# Patient Record
Sex: Female | Born: 2000 | Race: White | Hispanic: No | Marital: Single | Smoking: Never smoker
Health system: Southern US, Community
[De-identification: ages and names within clinical notes are randomized; demographics above are authoritative.]

## PROBLEM LIST (undated history)

## (undated) DIAGNOSIS — T7840XA Allergy, unspecified, initial encounter: Secondary | ICD-10-CM

## (undated) HISTORY — DX: Allergy, unspecified, initial encounter: T78.40XA

---
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
## 2020-03-05 ENCOUNTER — Encounter: Payer: Self-pay | Admitting: Family Medicine

## 2020-03-05 DIAGNOSIS — J0391 Acute recurrent tonsillitis, unspecified: Secondary | ICD-10-CM

## 2020-03-07 NOTE — Telephone Encounter (Signed)
I have faxed the referral over to the office and made pt aware of this.

## 2020-04-18 DIAGNOSIS — M79671 Pain in right foot: Secondary | ICD-10-CM | POA: Insufficient documentation

## 2020-04-18 DIAGNOSIS — M9270 Juvenile osteochondrosis of metatarsus, unspecified foot: Secondary | ICD-10-CM | POA: Insufficient documentation

## 2020-06-18 ENCOUNTER — Encounter: Payer: 59 | Admitting: Family Medicine

## 2020-06-19 ENCOUNTER — Encounter: Payer: Self-pay | Admitting: Family Medicine

## 2020-06-19 ENCOUNTER — Other Ambulatory Visit: Payer: Self-pay

## 2020-06-19 ENCOUNTER — Ambulatory Visit (INDEPENDENT_AMBULATORY_CARE_PROVIDER_SITE_OTHER): Payer: 59 | Admitting: Family Medicine

## 2020-06-19 DIAGNOSIS — Z0000 Encounter for general adult medical examination without abnormal findings: Secondary | ICD-10-CM | POA: Diagnosis not present

## 2020-06-19 MED ORDER — TRIAMCINOLONE ACETONIDE 0.1 % EX OINT: 1.0000 "application " | TOPICAL_OINTMENT | Freq: Two times a day (BID) | CUTANEOUS | 1 refills | Status: AC

## 2020-06-19 NOTE — Patient Instructions (Signed)
Follow up in 1 year or as needed No need for labs today- yay!!! Continue to work on healthy diet and regular exercise- you look great! Call with any questions or concerns Stay Safe!  Stay Healthy! Have a great summer!!!!

## 2020-06-19 NOTE — Progress Notes (Signed)
   Subjective:    Patient ID: Wendy Sanders, female    DOB: August 21, 2000, 20 y.o.   MRN: [id number]  HPI CPE- UTD on Tdap, COVID.  Home for the summer, trying to get a job.  Transferring to [employer] and will be living at home.  Pt is excited about the change.  Now majoring in nursing.  Reviewed past medical, surgical, family and social histories.   Health Maintenance  Topic Date Due   CHLAMYDIA SCREENING  06/19/2021 (Originally 12/01/2015)   Hepatitis C Screening  06/19/2021 (Originally 12/01/2018)   HIV Screening  06/19/2021 (Originally 12/01/2015)   INFLUENZA VACCINE  08/11/2020   TETANUS/TDAP  06/09/2021   Zoster Vaccines- Shingrix (1 of 2) 12/01/2050   HPV VACCINES  Completed   Pneumococcal Vaccine 76-104 Years old  Aged Out      Review of Systems Patient reports no vision/ hearing changes, fever, weight change,  persistant/recurrent hoarseness , swallowing issues, chest pain, palpitations, edema, persistant/recurrent cough, hemoptysis, dyspnea (rest/exertional/paroxysmal nocturnal), gastrointestinal bleeding (melena, rectal bleeding), abdominal pain, significant heartburn, bowel changes, GU symptoms (dysuria, hematuria, incontinence), Gyn symptoms (abnormal  bleeding, pain),  syncope, focal weakness, memory loss, numbness & tingling, hair/nail changes, abnormal bruising or bleeding, anxiety, or depression.   Transient LAD- typically resolve after 1 week.  Had one under each armpit, in groin, in neck.  Doesn't feel sick.  + rash R elbow- not itchy  This visit occurred during the SARS-CoV-2 public health emergency.  Safety protocols were in place, including screening questions prior to the visit, additional usage of staff PPE, and extensive cleaning of exam room while observing appropriate contact time as indicated for disinfecting solutions.      Objective:   Physical Exam General Appearance:    Alert, cooperative, no distress, appears stated age  Head:    Normocephalic, without obvious  abnormality, atraumatic  Eyes:    PERRL, conjunctiva/corneas clear, EOM's intact, fundi    benign, both eyes  Ears:    Normal TM's and external ear canals, both ears  Nose:   Deferred due to COVID  Throat:   Neck:   Supple, symmetrical, trachea midline, no adenopathy;    Thyroid: no enlargement/tenderness/nodules  Back:     Symmetric, no curvature, ROM normal, no CVA tenderness  Lungs:     Clear to auscultation bilaterally, respirations unlabored  Chest Wall:    No tenderness or deformity   Heart:    Regular rate and rhythm, S1 and S2 normal, no murmur, rub   or gallop  Breast Exam:    Deferred to GYN  Abdomen:     Soft, non-tender, bowel sounds active all four quadrants,    no masses, no organomegaly  Genitalia:    Deferred to GYN  Rectal:    Extremities:   Extremities normal, atraumatic, no cyanosis or edema  Pulses:   2+ and symmetric all extremities  Skin:   Skin color, texture, turgor normal, no rashes or lesions  Lymph nodes:   Cervical, supraclavicular, and axillary nodes normal  Neurologic:   CNII-XII intact, normal strength, sensation and reflexes    throughout          Assessment & Plan:

## 2020-06-19 NOTE — Assessment & Plan Note (Signed)
Pt's PE WNL.  UTD on Tdap, COVID.  Too young to start pap smears- due at age 20.  No need for labs based on risk factors.  Anticipatory guidance provided.

## 2020-06-25 ENCOUNTER — Other Ambulatory Visit: Payer: Self-pay | Admitting: Orthopaedic Surgery

## 2020-06-25 DIAGNOSIS — M79674 Pain in right toe(s): Secondary | ICD-10-CM

## 2020-06-26 ENCOUNTER — Other Ambulatory Visit: Payer: Self-pay

## 2020-06-26 ENCOUNTER — Other Ambulatory Visit: Payer: Self-pay | Admitting: Orthopaedic Surgery

## 2020-06-26 ENCOUNTER — Ambulatory Visit
Admission: RE | Admit: 2020-06-26 | Discharge: 2020-06-26 | Disposition: A | Discharge status: Home / Self Care | Payer: 59 | Source: Ambulatory Visit | Attending: Orthopaedic Surgery | Admitting: Orthopaedic Surgery

## 2020-06-26 DIAGNOSIS — M79674 Pain in right toe(s): Secondary | ICD-10-CM

## 2020-07-02 ENCOUNTER — Other Ambulatory Visit: Payer: Self-pay

## 2020-07-02 ENCOUNTER — Ambulatory Visit
Admission: RE | Admit: 2020-07-02 | Discharge: 2020-07-02 | Disposition: A | Discharge status: Home / Self Care | Payer: 59 | Source: Ambulatory Visit | Attending: Orthopaedic Surgery | Admitting: Orthopaedic Surgery

## 2020-07-02 DIAGNOSIS — M79674 Pain in right toe(s): Secondary | ICD-10-CM

## 2020-07-09 ENCOUNTER — Encounter: Payer: Self-pay | Admitting: *Deleted

## 2020-07-20 ENCOUNTER — Encounter: Payer: Self-pay | Admitting: Family Medicine

## 2020-08-19 ENCOUNTER — Other Ambulatory Visit: Payer: Self-pay

## 2020-08-19 ENCOUNTER — Encounter: Payer: Self-pay | Admitting: Family Medicine

## 2020-08-19 DIAGNOSIS — J0391 Acute recurrent tonsillitis, unspecified: Secondary | ICD-10-CM

## 2020-09-01 ENCOUNTER — Other Ambulatory Visit: Payer: Self-pay

## 2020-09-01 ENCOUNTER — Encounter: Payer: Self-pay | Admitting: Podiatry

## 2020-09-01 ENCOUNTER — Ambulatory Visit (INDEPENDENT_AMBULATORY_CARE_PROVIDER_SITE_OTHER): Payer: 59

## 2020-09-01 ENCOUNTER — Ambulatory Visit (INDEPENDENT_AMBULATORY_CARE_PROVIDER_SITE_OTHER): Payer: 59 | Admitting: Podiatry

## 2020-09-01 DIAGNOSIS — M79672 Pain in left foot: Secondary | ICD-10-CM

## 2020-09-01 DIAGNOSIS — M778 Other enthesopathies, not elsewhere classified: Secondary | ICD-10-CM | POA: Diagnosis not present

## 2020-09-01 DIAGNOSIS — M79671 Pain in right foot: Secondary | ICD-10-CM

## 2020-09-01 DIAGNOSIS — M779 Enthesopathy, unspecified: Secondary | ICD-10-CM

## 2020-09-01 MED ORDER — TRIAMCINOLONE ACETONIDE 10 MG/ML IJ SUSP
10.0000 mg | Freq: Once | INTRAMUSCULAR | Status: AC
  Administered 2020-09-01: 10 mg

## 2020-09-02 NOTE — Progress Notes (Signed)
Subjective:   Patient ID: Wendy Sanders, female   DOB: 20 y.o.   MRN: [id number]   HPI Patient presents stating that she has pain in her right forefoot of at least 8 months duration and states that she saw another physician who said she has Freiberg's.  She is had MRI done and it did indicate inflammation in the area and she had 1 cortisone injection which gave her approximate 6 weeks relief of symptoms.  She is starting college again now and is concerned about the pain she is experiencing in her right foot and presents for opinion.  Patient does not smoke likes to be active   Review of Systems  All other systems reviewed and are negative.      Objective:  Physical Exam Vitals and nursing note reviewed.  Constitutional:      Appearance: She is well-developed.  Pulmonary:     Effort: Pulmonary effort is normal.  Musculoskeletal:        General: Normal range of motion.  Skin:    General: Skin is warm.  Neurological:     Mental Status: She is alert.    Neurovascular status found to be intact muscle strength is found to be adequate range of motion adequate.  Patient is noted to have inflammation with fluid buildup around the second MPJ right and tenderness with pressure with no loss of joint motion or crepitus.  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  I do think it 1 point the consideration of a Hemi implant or possible core chondral bone drilling should be considered but at this point we want to try to help conservatively to buy her time.  I did do a proximal nerve block of the area and after appropriate numbness I aspirated the joint getting out a small amount of clear fluid and injected with quarter cc dexamethasone Kenalog and applied thick plantar pads  to try to disseminate weight off the joint.  She does have a graphite brace that was given to her that its not really giving her relief and we will try not to use it and I discussed orthotics or other treatments or possible surgery depending on her response to this treatment option  X-rays indicate what appears to be a Freiberg's infarction of the second metatarsal head right with shortening of the bone with a normal pattern except for elongated first metatarsal bones bilateral that are nonsymptomatic

## 2020-09-05 ENCOUNTER — Other Ambulatory Visit: Payer: Self-pay | Admitting: Podiatry

## 2020-09-05 DIAGNOSIS — M779 Enthesopathy, unspecified: Secondary | ICD-10-CM

## 2020-09-11 ENCOUNTER — Telehealth (INDEPENDENT_AMBULATORY_CARE_PROVIDER_SITE_OTHER): Payer: 59 | Admitting: Adult Health

## 2020-09-11 VITALS — Wt 140.0 lb

## 2020-09-11 DIAGNOSIS — J069 Acute upper respiratory infection, unspecified: Secondary | ICD-10-CM | POA: Diagnosis not present

## 2020-09-11 MED ORDER — AMOXICILLIN 500 MG PO TABS: 500.0000 mg | ORAL_TABLET | Freq: Two times a day (BID) | ORAL | 0 refills | Status: AC

## 2020-09-11 NOTE — Progress Notes (Signed)
Virtual Visit via Video Note  I connected with Wendy Sanders 09/11/20 at 11:00 AM EDT by a video enabled telemedicine application and verified that I am speaking with the correct person using two identifiers.  Location patient: home Location provider:work or home office Persons participating in the virtual visit: patient, provider  I discussed the limitations of evaluation and management by telemedicine and the availability of in person appointments. The patient expressed understanding and agreed to proceed.   HPI: 20 year old female, patient of Dr. Birdie Riddle, who I am evaluating today for an acute issue.  Her symptoms started roughly 4 days ago.  Symptoms include fever up to 101 ( resolved), chest congestion( resolved), nasal congestion, and a dry cough.  He denies chills, nausea, vomiting, diarrhea, shortness of breath.  He does feel as though she has improved to some degree over the last 24 hours.  Has been using Sudafed for 2 days and  Mucinex for 4 days, and took a dose of ibuprofen when she had a fever yesterday.  She did test negative for COVID via at home test.  Had COVID roughly a month ago.   ROS: See pertinent positives and negatives per HPI.  Past Medical History:  Diagnosis Date   Allergy     No past surgical history on file.  Family History  Problem Relation Age of Onset   Diabetes Father    Hypertension Maternal Grandmother    Diabetes Maternal Grandmother    Heart disease Paternal Grandfather        Current Outpatient Medications:    triamcinolone ointment (KENALOG) 0.1 %, Apply 1 application topically 2 (two) times daily., Disp: 90 g, Rfl: 1   COVID-19 Specimen Collection KIT, TEST AS DIRECTED TODAY (Patient not taking: No sig reported), Disp: , Rfl:    meloxicam (MOBIC) 7.5 MG tablet, Take 7.5 mg by mouth 3 (three) times daily. (Patient not taking: Reported on 09/11/2020), Disp: , Rfl:   EXAM:  VITALS per patient if applicable:  GENERAL: alert, oriented,  appears well and in no acute distress  HEENT: atraumatic, conjunttiva clear, no obvious abnormalities on inspection of external nose and ears  NECK: normal movements of the head and neck  LUNGS: on inspection no signs of respiratory distress, breathing rate appears normal, no obvious gross SOB, gasping or wheezing  CV: no obvious cyanosis  MS: moves all visible extremities without noticeable abnormality  PSYCH/NEURO: pleasant and cooperative, no obvious depression or anxiety, speech and thought processing grossly intact  ASSESSMENT AND PLAN:  Discussed the following assessment and plan:  1. Viral URI with cough -Explained to the patient that this is likely a viral URI.  She was advised rest and hydration, continue with over-the-counter medications.  Due to being a long holiday weekend, will send in amoxicillin that she can take if her symptoms do not continue to improve into the weekend. - amoxicillin (AMOXIL) 500 MG tablet; Take 1 tablet (500 mg total) by mouth 2 (two) times daily for 7 days.  Dispense: 14 tablet; Refill: 0 - Follow up PRN      I discussed the assessment and treatment plan with the patient. The patient was provided an opportunity to ask questions and all were answered. The patient agreed with the plan and demonstrated an understanding of the instructions.   The patient was advised to call back or seek an in-person evaluation if the symptoms worsen or if the condition fails to improve as anticipated.   Wendy Peng, NP

## 2020-10-20 ENCOUNTER — Other Ambulatory Visit: Payer: Self-pay

## 2020-10-20 ENCOUNTER — Ambulatory Visit (INDEPENDENT_AMBULATORY_CARE_PROVIDER_SITE_OTHER): Payer: 59

## 2020-10-20 ENCOUNTER — Ambulatory Visit (INDEPENDENT_AMBULATORY_CARE_PROVIDER_SITE_OTHER): Payer: 59 | Admitting: Podiatry

## 2020-10-20 ENCOUNTER — Encounter: Payer: Self-pay | Admitting: Podiatry

## 2020-10-20 DIAGNOSIS — S92324G Nondisplaced fracture of second metatarsal bone, right foot, subsequent encounter for fracture with delayed healing: Secondary | ICD-10-CM

## 2020-10-20 DIAGNOSIS — M778 Other enthesopathies, not elsewhere classified: Secondary | ICD-10-CM

## 2020-10-20 NOTE — Progress Notes (Signed)
Subjective:   Patient ID: Wendy Sanders, female   DOB: 20 y.o.   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  I do think ultimate surgical be necessary for this but we are concerned about changes within the second metatarsal head and the possibility that there is still some chondral reactive bone that is in an active phase and I did discuss this case with Dr. Sherryle Lis we reviewed x-rays and were both in agreement that a bone stimulator would be of short-term benefit with the possibility that this may improve her condition and prevent her from needing surgery.  Long-term very good possibility she will need some form of surgery with possibility for OATS procedure or some type of bone graft for long-term possible implant.  Very difficult problem for a young girl to have but with the change in bone and inflammation ultimately will have to be addressed  X-rays indicate that there is continued reactivity of the subchondral bone on multiple views with what appears to be collapse of the cartilage secondary to either injury or advanced Freiberg syndrome.  Discussed with patient and mother and do think that bone stimulator would be of benefit in this condition

## 2020-11-17 ENCOUNTER — Ambulatory Visit (INDEPENDENT_AMBULATORY_CARE_PROVIDER_SITE_OTHER): Payer: 59 | Admitting: Podiatry

## 2020-11-17 ENCOUNTER — Other Ambulatory Visit: Payer: Self-pay

## 2020-11-17 DIAGNOSIS — M9271 Juvenile osteochondrosis of metatarsus, right foot: Secondary | ICD-10-CM

## 2020-11-18 NOTE — Progress Notes (Signed)
  Subjective:  Patient ID: Wendy Sanders, female    DOB: April 25, 2000,  MRN: [id number]  Chief Complaint  Patient presents with   Fracture     4 WEEK FU  Freiberg disease     20 y.o. female presents with the above complaint. History confirmed with patient.  She is referred to me by Dr. Paulla Dolly for Wendy Sanders disease of the second MTPJ.  She has been using ultrasound bone stimulator for the last 4 weeks.  I previously examined her at her last appointment with him.  Since our neurostimulator she does not see that it has is helped much and may actually be more painful than previous.  Objective:  Physical Exam: warm, good capillary refill, no trophic changes or ulcerative lesions, normal DP and PT pulses, and normal sensory exam. Left Foot: normal exam, no swelling, tenderness, instability; ligaments intact, full range of motion of all ankle/foot joints Right Foot: Pain swelling and edema around the second MTPJ with range of motion that is limited  No images are attached to the encounter.  Radiographs: Multiple views x-ray of the right foot: Prior x-rays show flattening of the MTPJ of the metatarsal head  IMPRESSION: Flattening and central depression of the second metatarsal head articular surface with nondisplaced subchondral fracture and adjacent bony edema, compatible with Freiberg's infraction. Small reactive second MTP joint effusion.     Electronically Signed   By: Maurine Simmering   On: 07/03/2020 09:58 Assessment:  No diagnosis found.   Plan:  Patient was evaluated and treated and all questions answered.  I reviewed my clinical exam findings as well as the radiographs and last MRI findings again with the patient and her mother.  Has not had much improvement with noninvasive bone stimulation.  At this point she has had several months of nonsurgical treatment and has not improved.  I recommend we proceed with surgical intervention as proposed.  Surgical we discussed curettage of the  metatarsal head of the second MTPJ, bone grafting from calcaneal autograft and cartilage allografting.  All questions were addressed.  We discussed the risk benefits and potential complications including but not limited to pain, swelling, infection, scar, numbness which may be temporary or permanent, chronic pain, stiffness, nerve pain or damage, wound healing problems, bone healing problems including delayed or non-union.  Surgery scheduled for December 9.  I would like her to continue ultrasound bone stimulation after surgery as well.  We discussed a period of nonweightbearing for 4 to 6 weeks.  Informed consent was signed and reviewed   Surgical plan:  Procedure: -Surgical decompression of second MTPJ of Freiberg, calcaneal autograft, cartilage allografting  Location: -[facility]  Anesthesia plan: -IV sedation with regional block  Postoperative pain plan: - Tylenol 1000 mg every 6 hours, ibuprofen 600 mg every 6 hours, gabapentin 300 mg every 8 hours x5 days, oxycodone 5 mg 1-2 tabs every 6 hours only as needed  DVT prophylaxis: -None required  WB Restrictions / DME needs: -NWB in CAM boot which she has currently    No follow-ups on file.

## 2020-12-02 ENCOUNTER — Encounter: Payer: Self-pay | Admitting: Family Medicine

## 2020-12-02 ENCOUNTER — Other Ambulatory Visit: Payer: Self-pay

## 2020-12-03 ENCOUNTER — Other Ambulatory Visit: Payer: Self-pay | Admitting: Registered Nurse

## 2020-12-03 DIAGNOSIS — Z3041 Encounter for surveillance of contraceptive pills: Secondary | ICD-10-CM

## 2020-12-03 MED ORDER — NORETHINDRONE ACET-ETHINYL EST 1.5-30 MG-MCG PO TABS: 1.0000 | ORAL_TABLET | Freq: Every day | ORAL | 3 refills | Status: DC

## 2020-12-09 DIAGNOSIS — M79676 Pain in unspecified toe(s): Secondary | ICD-10-CM

## 2020-12-10 ENCOUNTER — Telehealth: Payer: Self-pay | Admitting: Urology

## 2020-12-10 NOTE — Telephone Encounter (Signed)
DOS - 12/19/20  ARTHROPLASTY LESSER MPJ 2ND RIGHT --- 28122 BONE GRAFT --- 20900  [facility] EFFECTIVE DATE - 2014-01-23  PER [employer] WEBSITE CPT CODES 55001 AND 64290 HAVE BEEN APPROVED, [place] # [id number], GOOD FROM 12/19/20 - 03/19/21

## 2020-12-19 ENCOUNTER — Other Ambulatory Visit: Payer: Self-pay | Admitting: Podiatry

## 2020-12-19 DIAGNOSIS — M9271 Juvenile osteochondrosis of metatarsus, right foot: Secondary | ICD-10-CM | POA: Diagnosis not present

## 2020-12-19 MED ORDER — ACETAMINOPHEN 500 MG PO TABS: 1000.0000 mg | ORAL_TABLET | Freq: Four times a day (QID) | ORAL | 0 refills | Status: AC | PRN

## 2020-12-19 MED ORDER — IBUPROFEN 600 MG PO TABS: 600.0000 mg | ORAL_TABLET | Freq: Four times a day (QID) | ORAL | 0 refills | Status: AC | PRN

## 2020-12-19 MED ORDER — GABAPENTIN 300 MG PO CAPS: 300.0000 mg | ORAL_CAPSULE | Freq: Three times a day (TID) | ORAL | 0 refills | Status: DC

## 2020-12-19 MED ORDER — OXYCODONE HCL 5 MG PO TABS: 5.0000 mg | ORAL_TABLET | ORAL | 0 refills | Status: AC | PRN

## 2020-12-19 NOTE — Progress Notes (Signed)
12/9 Freiberg decompression, bone / cartilage grafting

## 2020-12-23 ENCOUNTER — Other Ambulatory Visit: Payer: Self-pay | Admitting: Podiatry

## 2020-12-25 ENCOUNTER — Ambulatory Visit (INDEPENDENT_AMBULATORY_CARE_PROVIDER_SITE_OTHER): Payer: 59 | Admitting: Podiatry

## 2020-12-25 ENCOUNTER — Ambulatory Visit (INDEPENDENT_AMBULATORY_CARE_PROVIDER_SITE_OTHER): Payer: 59

## 2020-12-25 ENCOUNTER — Other Ambulatory Visit: Payer: Self-pay

## 2020-12-25 DIAGNOSIS — M9271 Juvenile osteochondrosis of metatarsus, right foot: Secondary | ICD-10-CM

## 2020-12-25 DIAGNOSIS — S92324G Nondisplaced fracture of second metatarsal bone, right foot, subsequent encounter for fracture with delayed healing: Secondary | ICD-10-CM | POA: Diagnosis not present

## 2020-12-25 NOTE — Progress Notes (Signed)
°  Subjective:  Patient ID: Wendy Sanders, female    DOB: [birth year],  MRN: [id number]  Chief Complaint  Patient presents with   Routine Post Op    POV #1 DOS 12/19/2020 TREATMENT OF FREIBERG'S DISEASE/2ND METATARSAL FRACTURE W/BONE GRAFT FROM HEEL AND CARTILAGE GRAFT      20 y.o. female returns for post-op check.  Doing well, pain was significant the first few days and is improving now  Review of Systems: Negative except as noted in the HPI. Denies N/V/F/Ch.   Objective:  There were no vitals filed for this visit. There is no height or weight on file to calculate BMI. Constitutional Well developed. Well nourished.  Vascular Foot warm and well perfused. Capillary refill normal to all digits.  Calf is soft and supple, no posterior calf or knee pain, negative Homans' sign  Neurologic Normal speech. Oriented to person, place, and time. Epicritic sensation to light touch grossly present bilaterally.  Dermatologic Skin healing well without signs of infection. Skin edges well coapted without signs of infection.  Minor abrasion anterior ankle superficial without skin disruption  Orthopedic: Tenderness to palpation noted about the surgical site.   Multiple view plain film radiographs: Kirschner wire intact and in good position Assessment:   1. Freiberg's disease, right   2. Closed nondisplaced fracture of second metatarsal bone of right foot with delayed healing, subsequent encounter    Plan:  Patient was evaluated and treated and all questions answered.  S/p foot surgery right -Progressing as expected post-operatively. -XR: As above -WB Status: NWB in CAM boot -Sutures: We will remove next visit. -Medications: No refills required -Foot redressed.  Plan to remove sutures and pin at next visit and begin range of motion of toe, continue nonweightbearing for 6 weeks total  No follow-ups on file.

## 2021-01-08 ENCOUNTER — Other Ambulatory Visit: Payer: Self-pay

## 2021-01-08 ENCOUNTER — Ambulatory Visit (INDEPENDENT_AMBULATORY_CARE_PROVIDER_SITE_OTHER): Payer: 59 | Admitting: Podiatry

## 2021-01-08 DIAGNOSIS — S92324G Nondisplaced fracture of second metatarsal bone, right foot, subsequent encounter for fracture with delayed healing: Secondary | ICD-10-CM

## 2021-01-08 DIAGNOSIS — M9271 Juvenile osteochondrosis of metatarsus, right foot: Secondary | ICD-10-CM

## 2021-01-12 NOTE — Progress Notes (Signed)
°  Subjective:  Patient ID: Wendy Sanders, female    DOB: 02/03/2000,  MRN: [id number]  Chief Complaint  Patient presents with   Routine Post Op      POV #2 DOS 12/19/2020 TREATMENT OF FREIBERG'S DISEASE/2ND METATARSAL FRACTURE W/BONE GRAFT FROM HEEL AND CARTILAGE GRAFT      21 y.o. female returns for post-op check.  Doing well, pain has been improving  Review of Systems: Negative except as noted in the HPI. Denies N/V/F/Ch.   Objective:  There were no vitals filed for this visit. There is no height or weight on file to calculate BMI. Constitutional Well developed. Well nourished.  Vascular Foot warm and well perfused. Capillary refill normal to all digits.  Calf is soft and supple, no posterior calf or knee pain, negative Homans' sign  Neurologic Normal speech. Oriented to person, place, and time. Epicritic sensation to light touch grossly present bilaterally.  Dermatologic Skin healing well without signs of infection. Skin edges well coapted without signs of infection.  Abrasion anterior ankle is healing  Orthopedic: Tenderness to palpation noted about the surgical site.   Multiple view plain film radiographs: Kirschner wire intact and in good position Assessment:   1. Closed nondisplaced fracture of second metatarsal bone of right foot with delayed healing, subsequent encounter   2. Freiberg's disease, right     Plan:  Patient was evaluated and treated and all questions answered.  S/p foot surgery right -Doing well I removed the Kirschner wire today and all of her sutures.  She may begin bathing regularly and I would like her to begin manual passive and active range of motion of the digit.  Continue nonweightbearing in the cam boot only.  Return in about 3 weeks (around 01/29/2021) for post op (new x-rays).

## 2021-01-19 ENCOUNTER — Telehealth: Payer: Self-pay | Admitting: *Deleted

## 2021-01-19 NOTE — Telephone Encounter (Signed)
Called patient and gave instructions per Dr Sherryle Lis to restart the bone stimulator, verbalized understanding and will restart right away.

## 2021-01-19 NOTE — Telephone Encounter (Signed)
Patient is calling for instructions to start back using the bone stimulator again or has she completed?Please advise.

## 2021-01-19 NOTE — Telephone Encounter (Signed)
Yes she should restart using it

## 2021-01-20 ENCOUNTER — Telehealth: Payer: Self-pay | Admitting: Podiatry

## 2021-01-20 NOTE — Telephone Encounter (Signed)
Patient is scheduled to return to work 01/26/2021, I noticed that she has begun to use bone stimulator again. Would you like to extend her leave, If so for how long?

## 2021-01-29 ENCOUNTER — Ambulatory Visit (INDEPENDENT_AMBULATORY_CARE_PROVIDER_SITE_OTHER): Payer: No Typology Code available for payment source

## 2021-01-29 ENCOUNTER — Ambulatory Visit (INDEPENDENT_AMBULATORY_CARE_PROVIDER_SITE_OTHER): Payer: No Typology Code available for payment source | Admitting: Podiatry

## 2021-01-29 ENCOUNTER — Other Ambulatory Visit: Payer: Self-pay

## 2021-01-29 DIAGNOSIS — M9271 Juvenile osteochondrosis of metatarsus, right foot: Secondary | ICD-10-CM

## 2021-01-29 DIAGNOSIS — S92324G Nondisplaced fracture of second metatarsal bone, right foot, subsequent encounter for fracture with delayed healing: Secondary | ICD-10-CM

## 2021-01-29 NOTE — Progress Notes (Signed)
°  Subjective:  Patient ID: Wendy Sanders, female    DOB: 02/20/2000,  MRN: [id number]  Chief Complaint  Patient presents with   Routine Post Op     (xray)POV #3 DOS 12/19/2020 TREATMENT OF FREIBERG'S DISEASE/2ND METATARSAL FRACTURE W/BONE GRAFT FROM HEEL AND CARTILAGE GRAFT RIGHT      21 y.o. female returns for post-op check.  Doing well, feels fairly good she does not have much pain she has been trying to move the toe and feels that the stiffness is getting better  Review of Systems: Negative except as noted in the HPI. Denies N/V/F/Ch.   Objective:  There were no vitals filed for this visit. There is no height or weight on file to calculate BMI. Constitutional Well developed. Well nourished.  Vascular Foot warm and well perfused. Capillary refill normal to all digits.  Calf is soft and supple, no posterior calf or knee pain, negative Homans' sign  Neurologic Normal speech. Oriented to person, place, and time. Epicritic sensation to light touch grossly present bilaterally.  Dermatologic Well-healed not hypertrophic incision.  Abrasion anterior ankle is well-healed slightly hyperpigmented  Orthopedic: She has no pain and good smooth range of motion of the second toe but is limited in range compared to the third toe   Multiple view plain film radiographs: Kirschner wire removed, no significant change in bone structure since last visit no collapse of the residual metatarsal head Assessment:   1. Closed nondisplaced fracture of second metatarsal bone of right foot with delayed healing, subsequent encounter   2. Freiberg's disease, right     Plan:  Patient was evaluated and treated and all questions answered.  S/p foot surgery right -Overall doing very well I transitioned her to weightbearing in the cam boot for the next 2 weeks and then can begin to transition to regular shoe gear after that gradually.  We discussed the transition process.  I want her to continue range of motion of  the toes manually and actively.  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  May send for physical therapy pending her progress.  Return in about 6 weeks (around 03/12/2021) for post op (no x-rays).

## 2021-03-12 ENCOUNTER — Encounter: Payer: No Typology Code available for payment source | Admitting: Podiatry

## 2021-03-23 ENCOUNTER — Encounter: Payer: No Typology Code available for payment source | Admitting: Podiatry

## 2021-04-09 ENCOUNTER — Telehealth: Payer: Self-pay

## 2021-04-09 NOTE — Telephone Encounter (Signed)
Yes, would recommend being seen sooner. If persistent through tomorrow or if she's experiencing any dizziness, lightheadedness, loss of consciousness, heavy menstrual bleeding, or other concerning symptoms, should see urgent care tomorrow.  ? ?Thanks, ? ?Rich

## 2021-04-09 NOTE — Telephone Encounter (Signed)
Patient is scheduled for 4/19 ? ?Nurse Assessment ?Nurse: Gildardo Pounds, RN, Amy Date/Time Eilene Ghazi Time): 04/08/2021 3:35:44 PM ?Confirm and document reason for call. If symptomatic, describe symptoms ?--Caller states she is having heart palpitations. The other day, she woke up from a nap & her HR was 143. This has been happening on and off for quite awhile. It will happen for a couple months or weeks & stop for a couple months or weeks & then come back. Sometimes it lasts a couple minutes. It has never lasted longer than 10-15 minutes. She gets SOB sometimes with it. ? ?Does the patient have any new or worsening symptoms? ---Yes ?Will a triage be completed? ---Yes ?Related visit to physician within the last 2 weeks? ---No ?Does the PT have any chronic conditions? (i.e. diabetes, asthma, this includes High risk factors for pregnancy, etc.) ---No ?Is the patient pregnant or possibly pregnant? (Ask all females between the ages of 54-55) ---No ?Is this a behavioral health or substance abuse call? ---No ? ? ?Guideline Title Affirmed Question Affirmed Notes Nurse Date/Time (Eastern Time) ?Heart Rate and Heartbeat Questions Problems with anxiety or stress ?Lovelace, RN, Amy 04/08/2021 3:38:11 PM ? ?04/08/2021 3:41:57 PM --See PCP within 2 Weeks  ?

## 2021-04-09 NOTE — Telephone Encounter (Signed)
Pt having below sxs and has appt for 4/19 does pt need to be seen sooner? ?

## 2021-04-10 NOTE — Telephone Encounter (Signed)
Called to discuss below message, no answer, did leave message for her to return call  ?

## 2021-04-10 NOTE — Telephone Encounter (Signed)
Patient called back, advised message below. Patient states its not a persistent issue that it just comes and goes so she would like to keep the appointment for the 19th. Did advise if it were to reoccur then it would be best to seek an urgent care, patient verbalized understanding and had no further questions.  ?

## 2021-04-14 ENCOUNTER — Ambulatory Visit (INDEPENDENT_AMBULATORY_CARE_PROVIDER_SITE_OTHER): Payer: No Typology Code available for payment source

## 2021-04-14 ENCOUNTER — Ambulatory Visit (INDEPENDENT_AMBULATORY_CARE_PROVIDER_SITE_OTHER): Payer: No Typology Code available for payment source | Admitting: Podiatry

## 2021-04-14 DIAGNOSIS — M9271 Juvenile osteochondrosis of metatarsus, right foot: Secondary | ICD-10-CM

## 2021-04-14 DIAGNOSIS — Z9889 Other specified postprocedural states: Secondary | ICD-10-CM

## 2021-04-19 ENCOUNTER — Encounter: Payer: Self-pay | Admitting: Podiatry

## 2021-04-19 NOTE — Progress Notes (Signed)
?  Subjective:  ?Patient ID: Wendy Sanders, female    DOB: 2000/08/04,  MRN: [id number] ? ?Chief Complaint  ?Patient presents with  ? Routine Post Op  ?   (xray)POV #4 DOS 12/19/2020 TREATMENT OF FREIBERG'S DISEASE/2ND METATARSAL FRACTURE W/BONE GRAFT FROM HEEL AND CARTILAGE GRAFT  ? ? ? ? ?21 y.o. female returns for post-op check.  She is now 4 months after surgery.  Overall the swelling has come down quite a bit the toe still feels stiff.  She does note soreness in the toe when she is on the foot for a long period of time towards the end of the day. ? ?Review of Systems: Negative except as noted in the HPI. Denies N/V/F/Ch. ? ? ?Objective:  ?There were no vitals filed for this visit. ?There is no height or weight on file to calculate BMI. ?Constitutional Well developed. ?Well nourished.  ?Vascular Foot warm and well perfused. ?Capillary refill normal to all digits.  Calf is soft and supple, no posterior calf or knee pain, negative Homans' sign  ?Neurologic Normal speech. ?Oriented to person, place, and time. ?Epicritic sensation to light touch grossly present bilaterally.  ?Dermatologic Well-healed not hypertrophic incision.  ?Orthopedic: Minimal pain with palpation of the joint.  There is limited range of motion.  ? ?Multiple view plain film radiographs: Stable appearance of remaining metatarsal head ?Assessment:  ? ?1. Status post right foot surgery   ?2. Freiberg's disease, right   ? ? ?Plan:  ?Patient was evaluated and treated and all questions answered. ? ?S/p foot surgery right ?-Overall doing okay.  Still has quite a bit of stiffness in the toe.  Pain is much improved from prior to surgery.  Still has limited range of motion and some pain in the joint towards the end of the day.  Other than avoiding impact activity for another 2 months I would have no further restrictions for this point hopefully continues to slowly improve advised on range of motion of the toes.  I will see her back in 2 months for  follow-up ? ? ? ?Return in about 2 months (around 06/14/2021) for surgery follow up (new foot xrays) .  ? ?

## 2021-04-29 ENCOUNTER — Encounter: Payer: Self-pay | Admitting: Family Medicine

## 2021-04-29 ENCOUNTER — Other Ambulatory Visit: Payer: Self-pay

## 2021-04-29 ENCOUNTER — Ambulatory Visit (INDEPENDENT_AMBULATORY_CARE_PROVIDER_SITE_OTHER): Payer: No Typology Code available for payment source | Admitting: Family Medicine

## 2021-04-29 VITALS — BP 118/66 | HR 88 | Temp 98.3°F | Resp 18 | Ht 63.0 in | Wt 140.2 lb

## 2021-04-29 DIAGNOSIS — R002 Palpitations: Secondary | ICD-10-CM

## 2021-04-29 DIAGNOSIS — F418 Other specified anxiety disorders: Secondary | ICD-10-CM | POA: Diagnosis not present

## 2021-04-29 LAB — CBC WITH DIFFERENTIAL/PLATELET
Basophils Absolute: 0 10*3/uL (ref 0.0–0.1)
Basophils Relative: 0.6 % (ref 0.0–3.0)
Eosinophils Absolute: 0.1 10*3/uL (ref 0.0–0.7)
Eosinophils Relative: 2.3 % (ref 0.0–5.0)
HCT: 42 % (ref 36.0–46.0)
Hemoglobin: 14.2 g/dL (ref 12.0–15.0)
Lymphocytes Relative: 47.1 % — ABNORMAL HIGH (ref 12.0–46.0)
Lymphs Abs: 3 10*3/uL (ref 0.7–4.0)
MCHC: 33.7 g/dL (ref 30.0–36.0)
MCV: 86.2 fl (ref 78.0–100.0)
Monocytes Absolute: 0.5 10*3/uL (ref 0.1–1.0)
Monocytes Relative: 7.5 % (ref 3.0–12.0)
Neutro Abs: 2.7 10*3/uL (ref 1.4–7.7)
Neutrophils Relative %: 42.5 % — ABNORMAL LOW (ref 43.0–77.0)
Platelets: 254 10*3/uL (ref 150.0–400.0)
RBC: 4.88 Mil/uL (ref 3.87–5.11)
RDW: 12.2 % (ref 11.5–14.6)
WBC: 6.3 10*3/uL (ref 4.5–10.5)

## 2021-04-29 LAB — BASIC METABOLIC PANEL
BUN: 13 mg/dL (ref 6–23)
CO2: 26 mEq/L (ref 19–32)
Calcium: 9.6 mg/dL (ref 8.4–10.5)
Chloride: 103 mEq/L (ref 96–112)
Creatinine, Ser: 0.68 mg/dL (ref 0.40–1.20)
GFR: 125.38 mL/min (ref >60.00)
Glucose, Bld: 82 mg/dL (ref 70–99)
Potassium: 4.9 mEq/L (ref 3.5–5.1)
Sodium: 138 mEq/L (ref 135–145)

## 2021-04-29 LAB — HEPATIC FUNCTION PANEL
ALT: 13 U/L (ref 0–35)
AST: 15 U/L (ref 0–37)
Albumin: 4.6 g/dL (ref 3.5–5.2)
Alkaline Phosphatase: 68 U/L (ref 39–117)
Bilirubin, Direct: 0.1 mg/dL (ref 0.0–0.3)
Total Bilirubin: 0.4 mg/dL (ref 0.2–1.2)
Total Protein: 7.4 g/dL (ref 6.0–8.3)

## 2021-04-29 LAB — TSH: TSH: 2.87 u[IU]/mL (ref 0.35–5.50)

## 2021-04-29 MED ORDER — ESCITALOPRAM OXALATE 10 MG PO TABS: 10.0000 mg | ORAL_TABLET | Freq: Every day | ORAL | 3 refills | Status: DC

## 2021-04-29 NOTE — Assessment & Plan Note (Signed)
New to provider.  Pt's PHQ=17 today.  She admits to high levels of anxiety and is open to idea of medication to improve her current sxs.  She was previously on Lexapro and this worked well for her.  Will restart at '10mg'$  daily and adjust as needed.  Pt expressed understanding and is in agreement w/ plan.  ?

## 2021-04-29 NOTE — Progress Notes (Signed)
? ?  Subjective:  ? ? Patient ID: Wendy Sanders, female    DOB: 11/13/2000, 21 y.o.   MRN: [id number] ? ?HPI ?Palpitations- 'my heart just races sometimes'.  Pt feels sxs started after having COVID in Aug 2021.  Reports she will have sxs for a few months and then it will resolve for a few months before returning.  Sxs do not occur at a particular time of day.  Recently she was lying down for a nap and woke up feeling mild SOB and pulse ox indicated HR was 145.  Pt reports high levels of stress and anxiety w/ school.  Has not correlated the palpitations w/ her anxiety.  'it just happens at random times'.  Last episode was a few days ago when she was walking from couch to bathroom.  No dizziness.  Has some SOB w/ these episodes but not every episode.  Denies nausea or diaphoresis.  Sxs will resolve spontaneously and have never lasted longer than 5 minutes. ? ?Depression/anxiety- pt scored 17 on PHQ today.  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  Pt is open to idea of medication to improve sxs.  Previously on Lexapro w/ good results ? ? ?Review of Systems ?For ROS see HPI  ?   ?Objective:  ? Physical Exam ?Vitals reviewed.  ?Constitutional:   ?   General: She is not in acute distress. ?   Appearance: She is well-developed. She is not ill-appearing.  ?HENT:  ?   Head: Normocephalic and atraumatic.  ?Eyes:  ?   Conjunctiva/sclera: Conjunctivae normal.  ?   Pupils: Pupils are equal, round, and reactive to light.  ?Neck:  ?   Thyroid: No thyromegaly.  ?Cardiovascular:  ?   Rate and Rhythm: Normal rate and regular rhythm.  ?   Heart sounds: Normal heart sounds. No murmur heard. ?Pulmonary:  ?   Effort: Pulmonary effort is normal. No respiratory distress.  ?   Breath sounds: Normal breath sounds.  ?Abdominal:  ?   General: There is no distension.  ?   Palpations: Abdomen is soft.  ?   Tenderness: There is no abdominal tenderness.  ?Musculoskeletal:  ?   Cervical back: Normal range of motion and  neck supple.  ?   Right lower leg: No edema.  ?   Left lower leg: No edema.  ?Lymphadenopathy:  ?   Cervical: No cervical adenopathy.  ?Skin: ?   General: Skin is warm and dry.  ?Neurological:  ?   Mental Status: She is alert and oriented to person, place, and time.  ?Psychiatric:     ?   Behavior: Behavior normal.  ? ? ? ? ? ?   ?Assessment & Plan:  ? ?Palpitations- new.  Pt reports she has had episodic sxs since having COVID Aug 2021.  She will have multiple episodes and then won't have anything for months.  It can be associated w/ mild SOB but no CP, dizziness, nausea, diaphoresis.  EKG today WNL.  Sxs sound suspicious for SVT.  Will check labs to r/o metabolic cause of palpitations and refer to Cards for complete evaluation and likely holter monitor.  Pt expressed understanding and is in agreement w/ plan.  ?

## 2021-04-29 NOTE — Patient Instructions (Addendum)
Follow up in 4-6 weeks to recheck mood ?We'll notify you of your lab results and make any changes if needed ?We'll call you with your Cardiology appt just to make sure everything is ok ?RESTART the Lexapro once daily ?Call with any questions or concerns ?GOOD LUCK WITH FINALS!!! ?

## 2021-05-27 ENCOUNTER — Ambulatory Visit (INDEPENDENT_AMBULATORY_CARE_PROVIDER_SITE_OTHER): Payer: No Typology Code available for payment source | Admitting: Family Medicine

## 2021-05-27 ENCOUNTER — Encounter: Payer: Self-pay | Admitting: Family Medicine

## 2021-05-27 VITALS — BP 124/76 | HR 88 | Temp 97.7°F | Resp 16 | Wt 139.8 lb

## 2021-05-27 DIAGNOSIS — R002 Palpitations: Secondary | ICD-10-CM | POA: Diagnosis not present

## 2021-05-27 DIAGNOSIS — F418 Other specified anxiety disorders: Secondary | ICD-10-CM | POA: Diagnosis not present

## 2021-05-27 MED ORDER — ESCITALOPRAM OXALATE 20 MG PO TABS: 20.0000 mg | ORAL_TABLET | Freq: Every day | ORAL | 3 refills | Status: DC

## 2021-05-27 NOTE — Progress Notes (Signed)
? ?  Subjective:  ? ? Patient ID: Wendy Sanders, female    DOB: 23-May-2000, 21 y.o.   MRN: [id number] ? ?HPI ?Depression w/ anxiety- was started on Lexapro '10mg'$  daily at last visit.  Pt reports no change since starting medication.  Mom says that there has been a difference.  No side effects at this time.  Lexapro has worked well for pt in the past.  Pt did well on finals.  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 ?Palpitations- pt reports she hasn't had any since last visit. ? ? ?Review of Systems ?For ROS see HPI  ?   ?Objective:  ? Physical Exam ?Vitals reviewed.  ?Constitutional:   ?   General: She is not in acute distress. ?   Appearance: Normal appearance. She is not ill-appearing.  ?HENT:  ?   Head: Normocephalic and atraumatic.  ?Skin: ?   General: Skin is warm and dry.  ?Neurological:  ?   General: No focal deficit present.  ?   Mental Status: She is alert and oriented to person, place, and time.  ?Psychiatric:     ?   Mood and Affect: Mood normal.     ?   Behavior: Behavior normal.     ?   Thought Content: Thought content normal.  ? ? ? ? ? ?   ?Assessment & Plan:  ?Palpitations- resolved since starting Lexapro ? ?

## 2021-05-27 NOTE — Patient Instructions (Signed)
Follow up in 6 weeks to recheck mood ?INCREASE the Lexapro from '10mg'$  --> '20mg'$  daily (take 2 of what you have at home and 1 of the new prescription) ?Keep up the good work!  You look great! ?I'm THRILLED that the heart is behaving!!! ?Call with any questions or concerns ?Have a great summer!!! ?

## 2021-05-27 NOTE — Assessment & Plan Note (Signed)
Ongoing issue.  Pt doesn't feel that she has noticed improvement but her mom has noticed a change and she has not had any palpitations since starting medication.  Pt feels like going up on the medication would be beneficial so will start '20mg'$  daily and monitor for improvement.  Pt expressed understanding and is in agreement w/ plan.  ?

## 2021-06-16 ENCOUNTER — Encounter: Payer: Self-pay | Admitting: Podiatry

## 2021-06-16 ENCOUNTER — Ambulatory Visit (INDEPENDENT_AMBULATORY_CARE_PROVIDER_SITE_OTHER): Payer: No Typology Code available for payment source

## 2021-06-16 ENCOUNTER — Ambulatory Visit (INDEPENDENT_AMBULATORY_CARE_PROVIDER_SITE_OTHER): Payer: No Typology Code available for payment source | Admitting: Podiatry

## 2021-06-16 ENCOUNTER — Telehealth: Payer: Self-pay | Admitting: Podiatry

## 2021-06-16 DIAGNOSIS — M9271 Juvenile osteochondrosis of metatarsus, right foot: Secondary | ICD-10-CM | POA: Diagnosis not present

## 2021-06-16 DIAGNOSIS — S92324G Nondisplaced fracture of second metatarsal bone, right foot, subsequent encounter for fracture with delayed healing: Secondary | ICD-10-CM

## 2021-06-16 NOTE — Telephone Encounter (Signed)
Please send order for MRI to  Wendy [facility]  - she does not want to go to [place]

## 2021-06-22 ENCOUNTER — Telehealth: Payer: Self-pay | Admitting: Podiatry

## 2021-06-22 NOTE — Telephone Encounter (Signed)
Pt states a referral to [facility] was to be sent in but she called them and they do not have the referral.  Please advise

## 2021-06-22 NOTE — Progress Notes (Signed)
  Subjective:  Patient ID: Wendy Sanders, female    DOB: [birth year],  MRN: [id number]  Chief Complaint  Patient presents with   Fracture     POV #5 DOS 12/19/2020 TREATMENT OF FREIBERG'S DISEASE/2ND METATARSAL FRACTURE W/BONE GRAFT FROM HEEL AND CARTILAGE GRAFT.  2 months (around 06/14/2021) for surgery follow up (new foot xrays)      21 y.o. female returns for post-op check.  She is now 6 months out after surgery.  Still note some stiffness.  Not as much swelling.  She is starting to have a little bit more pain than she was having previously.  Review of Systems: Negative except as noted in the HPI. Denies N/V/F/Ch.   Objective:  There were no vitals filed for this visit. There is no height or weight on file to calculate BMI. Constitutional Well developed. Well nourished.  Vascular Foot warm and well perfused. Capillary refill normal to all digits.  Calf is soft and supple, no posterior calf or knee pain, negative Homans' sign  Neurologic Normal speech. Oriented to person, place, and time. Epicritic sensation to light touch grossly present bilaterally.  Dermatologic Well-healed not hypertrophic incision.  Orthopedic: She does have some pain with range of motion of the joint.  There is limited range of motion.   Multiple view plain film radiographs: Slightly increased sclerosis around the medial side of the metatarsal head today Assessment:   1. Closed nondisplaced fracture of second metatarsal bone of right foot with delayed healing, subsequent encounter   2. Freiberg's disease, right     Plan:  Patient was evaluated and treated and all questions answered.  S/p foot surgery right -Today seems to be doing somewhat worse with the joint than she was previously. Having increased pain.  I would like to evaluate the new MRI to see if the graft has incorporated any or if there is any further aseptic necrosis around the metatarsal head.  This was ordered and I will see her back after the  MRI.  We may need to consider further surgical intervention including a Hemi implant arthroplasty which we discussed.  I will see her back after the MRI.  Return for after MRI to review.

## 2021-06-24 NOTE — Telephone Encounter (Signed)
Left voice message of information given regarding referral. Advised to check back with [facility] for scheduling.

## 2021-07-08 ENCOUNTER — Encounter: Payer: Self-pay | Admitting: Family Medicine

## 2021-07-08 ENCOUNTER — Ambulatory Visit (INDEPENDENT_AMBULATORY_CARE_PROVIDER_SITE_OTHER): Payer: No Typology Code available for payment source | Admitting: Family Medicine

## 2021-07-08 VITALS — BP 122/80 | HR 81 | Temp 98.4°F | Resp 16 | Ht 63.0 in | Wt 143.0 lb

## 2021-07-08 DIAGNOSIS — F418 Other specified anxiety disorders: Secondary | ICD-10-CM | POA: Diagnosis not present

## 2021-07-08 DIAGNOSIS — L309 Dermatitis, unspecified: Secondary | ICD-10-CM | POA: Insufficient documentation

## 2021-07-08 DIAGNOSIS — Z23 Encounter for immunization: Secondary | ICD-10-CM | POA: Diagnosis not present

## 2021-07-08 DIAGNOSIS — L700 Acne vulgaris: Secondary | ICD-10-CM | POA: Insufficient documentation

## 2021-07-08 NOTE — Patient Instructions (Signed)
Schedule your complete physical in 3 months No need for blood work or med changes!!! Keep up the good work!  You look great! Call with any questions or concerns Have a great summer!!!

## 2021-07-08 NOTE — Progress Notes (Signed)
   Subjective:    Patient ID: Wendy Sanders, female    DOB: [birth year], 21 y.o.   MRN: [id number]  HPI Depression/Anxiety- at last visit we increased Lexapro to '20mg'$  daily.  Pt feels that increasing the dose has made an improvement.  Pt reports sleeping better but doesn't feel the need to sleep all day.  More relaxed.  Pt reports medication makes the day easier.     Review of Systems For ROS see HPI     Objective:   Physical Exam Vitals reviewed.  Constitutional:      General: She is not in acute distress.    Appearance: Normal appearance. She is not ill-appearing.  HENT:     Head: Normocephalic and atraumatic.  Skin:    General: Skin is warm and dry.  Neurological:     General: No focal deficit present.     Mental Status: She is alert and oriented to person, place, and time.  Psychiatric:        Mood and Affect: Mood normal.        Behavior: Behavior normal.        Thought Content: Thought content normal.           Assessment & Plan:

## 2021-07-08 NOTE — Addendum Note (Signed)
Addended byPauline Good, Marguetta Windish on: 07/08/2021 09:01 AM   Modules accepted: Orders

## 2021-07-08 NOTE — Assessment & Plan Note (Signed)
Much improved w/ increased dose of Lexapro.  Will continue at '20mg'$  daily.  No med changes at this time.  Will follow.

## 2021-07-10 ENCOUNTER — Other Ambulatory Visit: Payer: Self-pay | Admitting: Podiatry

## 2021-07-10 ENCOUNTER — Telehealth: Payer: Self-pay | Admitting: Podiatry

## 2021-07-10 ENCOUNTER — Encounter: Payer: Self-pay | Admitting: Podiatry

## 2021-07-10 DIAGNOSIS — M86171 Other acute osteomyelitis, right ankle and foot: Secondary | ICD-10-CM

## 2021-07-10 MED ORDER — CEPHALEXIN 500 MG PO CAPS: 500.0000 mg | ORAL_CAPSULE | Freq: Three times a day (TID) | ORAL | 0 refills | Status: DC

## 2021-07-10 NOTE — Telephone Encounter (Signed)
Patients mom called to ask about the MRI results. I spoke to Dr. Sherryle Lis about the results. He states that it healed fine but some swelling. It never looked infected. Patient denies any redness, fevers, chills.   I have sent keflex and would like to check blood work. Ordered ESR, CRP, CBC, BMP.   Patient verbalized understanding. Discussed with Dr. Sherryle Lis.

## 2021-07-11 NOTE — Telephone Encounter (Signed)
I called the patient on 6/30 to go over results.   Phineas Real- can you see about getting her sooner? Thanks!

## 2021-07-15 ENCOUNTER — Ambulatory Visit (INDEPENDENT_AMBULATORY_CARE_PROVIDER_SITE_OTHER): Payer: No Typology Code available for payment source | Admitting: Podiatry

## 2021-07-15 DIAGNOSIS — M9271 Juvenile osteochondrosis of metatarsus, right foot: Secondary | ICD-10-CM | POA: Diagnosis not present

## 2021-07-15 NOTE — Telephone Encounter (Signed)
I have a 3:15 that's opened today or can use the 1:30 new slot

## 2021-07-16 ENCOUNTER — Encounter: Payer: Self-pay | Admitting: Podiatry

## 2021-07-16 LAB — BASIC METABOLIC PANEL
BUN/Creatinine Ratio: 19 (ref 9–23)
BUN: 14 mg/dL (ref 6–20)
CO2: 21 mmol/L (ref 20–29)
Calcium: 8.8 mg/dL (ref 8.7–10.2)
Chloride: 99 mmol/L (ref 96–106)
Creatinine, Ser: 0.74 mg/dL (ref 0.57–1.00)
Glucose: 89 mg/dL (ref 70–99)
Potassium: 4 mmol/L (ref 3.5–5.2)
Sodium: 135 mmol/L (ref 134–144)
eGFR: 119 mL/min/{1.73_m2} (ref >59)

## 2021-07-16 LAB — CBC WITH DIFFERENTIAL/PLATELET
Basophils Absolute: 0 10*3/uL (ref 0.0–0.2)
Basos: 0 %
EOS (ABSOLUTE): 0.1 10*3/uL (ref 0.0–0.4)
Eos: 2 %
Hematocrit: 40.4 % (ref 34.0–46.6)
Hemoglobin: 13.4 g/dL (ref 11.1–15.9)
Immature Grans (Abs): 0 10*3/uL (ref 0.0–0.1)
Immature Granulocytes: 1 %
Lymphocytes Absolute: 2.5 10*3/uL (ref 0.7–3.1)
Lymphs: 44 %
MCH: 28.5 pg (ref 26.6–33.0)
MCHC: 33.2 g/dL (ref 31.5–35.7)
MCV: 86 fL (ref 79–97)
Monocytes Absolute: 0.5 10*3/uL (ref 0.1–0.9)
Monocytes: 9 %
Neutrophils Absolute: 2.5 10*3/uL (ref 1.4–7.0)
Neutrophils: 44 %
Platelets: 252 10*3/uL (ref 150–450)
RBC: 4.71 x10E6/uL (ref 3.77–5.28)
RDW: 11.4 % — ABNORMAL LOW (ref 11.7–15.4)
WBC: 5.7 10*3/uL (ref 3.4–10.8)

## 2021-07-16 LAB — SEDIMENTATION RATE: Sed Rate: 2 mm/hr (ref 0–32)

## 2021-07-16 LAB — C-REACTIVE PROTEIN: CRP: 2 mg/L (ref 0–10)

## 2021-07-16 NOTE — Progress Notes (Signed)
Subjective:  Patient ID: Wendy Sanders, female    DOB: [birth year],  MRN: [id number]  Chief Complaint  Patient presents with   Fracture       mri results      21 y.o. female returns for post-op check.  She returns for follow-up after completing the MRI.  Overall still feels about the same she is having pain and some stiffness but is about the same as it was before.  It has not changed since she started taking the antibiotics that Dr. Jacqualyn Posey prescribed over the weekend following the MRI report.  She completed her lab work this morning at [facility].  Review of Systems: Negative except as noted in the HPI. Denies N/V/F/Ch.   Objective:  There were no vitals filed for this visit. There is no height or weight on file to calculate BMI. Constitutional Well developed. Well nourished.  Vascular Foot warm and well perfused. Capillary refill normal to all digits.  Calf is soft and supple, no posterior calf or knee pain, negative Homans' sign  Neurologic Normal speech. Oriented to person, place, and time. Epicritic sensation to light touch grossly present bilaterally.  Dermatologic Well-healed not hypertrophic incision.  No cellulitis no open lesions  Orthopedic: She does have some pain with end range of motion of the joint.  There is limited range of motion when compared to the contralateral limb or the adjacent digits.   Multiple view plain film radiographs: Slightly increased sclerosis around the medial side of the metatarsal head  MRI [place] 07/08/2021: CONCLUSION:  1. Findings as above may reflect combination of postsurgical changes/progression of disease above the head of second metatarsal. However, given additional abnormal findings in the second proximal phalanx, possibility of septic arthritis-acute osteomyelitis cannot be entirely excluded. No soft tissue fluid collections. 2. Abnormal signal in head and neck of fourth metatarsal may reflect early  osteonecrosis/stress related. Assessment:   1. Freiberg's disease, right      Plan:  Patient was evaluated and treated and all questions answered.  S/p foot surgery right -She completed the MRI and we reviewed the report today.  We will work on getting the CD images so I can review them personally.  I discussed with her that I think the chance of this being an infection is almost 0.  She has had no change with antibiotics.  I advised her to complete this course simply as a precaution.  We also discussed obtaining a joint fluid culture.  Following sterile prep with Betadine and a local field block, attempted to aspirate the joint of synovial fluid, no synovial fluid was able to be aspirated.  I was able to review her lab results which were completed after the visit today and message her about this as well.  CRP ESR and CBC are all within normal limits.  Nearly no elevations in any inflammatory markers.  I think the chance of this being infectious is almost 0, I do not think an open biopsy would be necessary.  She has had no systemic or local clinical signs of infection.  Rather I think the changes seen on the MRI by the radiologist are likely postsurgical and represent resorption of the bone and cartilage graft and early arthritic changes.  We discussed further treatment for this.  The pain right now at its worst is about a 3 or 4 out of 10 and developed later in the day which has been on her feet quite a lot.  I think she can  continue her regular shoe gear.  Eventually she will likely need implant arthroplasty of the joint.  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  I will see her back in a few months to reevaluate or sooner if new symptoms arise or change, or if I see any concerning images on the MRI once I have the report and CDs in hand  No follow-ups on file.

## 2021-08-19 ENCOUNTER — Ambulatory Visit (INDEPENDENT_AMBULATORY_CARE_PROVIDER_SITE_OTHER): Payer: No Typology Code available for payment source | Admitting: Podiatry

## 2021-08-19 ENCOUNTER — Ambulatory Visit (INDEPENDENT_AMBULATORY_CARE_PROVIDER_SITE_OTHER): Payer: No Typology Code available for payment source

## 2021-08-19 DIAGNOSIS — M9271 Juvenile osteochondrosis of metatarsus, right foot: Secondary | ICD-10-CM

## 2021-08-19 DIAGNOSIS — S90121A Contusion of right lesser toe(s) without damage to nail, initial encounter: Secondary | ICD-10-CM | POA: Diagnosis not present

## 2021-08-19 NOTE — Progress Notes (Signed)
  Subjective:  Patient ID: Wendy Sanders, female    DOB: [birth year],  MRN: [id number]  Chief Complaint  Patient presents with   freiberg's disease    MRI review / surgery consult right foot      21 y.o. female returns for post-op check.  She returns for follow-up, she completed the lab work after the last visit.  2 weeks ago she hyperextended the toe on a diving board and it has been quite painful for her since.  She would like to move up surgery.  Review of Systems: Negative except as noted in the HPI. Denies N/V/F/Ch.   Objective:  There were no vitals filed for this visit. There is no height or weight on file to calculate BMI. Constitutional Well developed. Well nourished.  Vascular Foot warm and well perfused. Capillary refill normal to all digits.  Calf is soft and supple, no posterior calf or knee pain, negative Homans' sign  Neurologic Normal speech. Oriented to person, place, and time. Epicritic sensation to light touch grossly present bilaterally.  Dermatologic Well-healed not hypertrophic incision.  No cellulitis no open lesions  Orthopedic: Pain to palpation around the second MPJ, proximal phalanx and with range of motion.   Multiple view plain film radiographs: No evidence of new fracture or complication  MRI [facility] [facility] 07/08/2021: CONCLUSION:  1. Findings as above may reflect combination of postsurgical changes/progression of disease above the head of second metatarsal. However, given additional abnormal findings in the second proximal phalanx, possibility of septic arthritis-acute osteomyelitis cannot be entirely excluded. No soft tissue fluid collections. 2. Abnormal signal in head and neck of fourth metatarsal may reflect early osteonecrosis/stress related.  Previous I independently reviewed the MRI imaging and I agree with the report above   CBC ESR CRP and metabolic panel were within normal limits Assessment:   1. Freiberg's disease, right       Plan:  Patient was evaluated and treated and all questions answered.  S/p foot surgery right -Reviewed her radiographs with her today.  Discussed that her lab work was normal.  I also still agree that the MRI imaging is likely sequela of the Freiberg's progression and failure of the graft to integrate as opposed to infection or septic arthritis sequela.  She is ready to schedule surgery.  We discussed implant arthroplasty and the risk benefits and potential complications of the procedure.  All questions were addressed.  Informed sent was signed and reviewed.  Will schedule surgery for October 6    Surgical plan:  Procedure: -Hemi implant joint replacement second toe right foot   Location: -[facility]  Anesthesia plan: -IV sedation with local block  Postoperative pain plan: -Seglentis 2 tablets every 12 hours  DVT prophylaxis: -none required  WB Restrictions / DME needs: -WBAT in boot which she has. She will use knee scooter for longer distances     No follow-ups on file.

## 2021-08-24 ENCOUNTER — Ambulatory Visit: Payer: No Typology Code available for payment source | Admitting: Podiatry

## 2021-09-03 ENCOUNTER — Encounter: Payer: Self-pay | Admitting: Podiatry

## 2021-09-16 ENCOUNTER — Telehealth: Payer: Self-pay | Admitting: Family Medicine

## 2021-09-16 ENCOUNTER — Other Ambulatory Visit: Payer: Self-pay

## 2021-09-16 MED ORDER — ESCITALOPRAM OXALATE 20 MG PO TABS: 20.0000 mg | ORAL_TABLET | Freq: Every day | ORAL | 3 refills | Status: DC

## 2021-09-16 NOTE — Telephone Encounter (Signed)
Refill sent in and I advised her that she will mostly likely will have to pay out of pocket

## 2021-09-16 NOTE — Telephone Encounter (Signed)
Encourage patient to contact the pharmacy for refills or they can request refills through [facility]  (Please schedule appointment if patient has not been seen in over a year)    WHAT [place] TO: [facility] [address] [phone]  MEDICATION NAME & DOSE: lexapro '20mg'$    NOTES/COMMENTS FROM PATIENT: states that dog got into pills and pt is unable to take  the rest of her supply       Front office please notify patient: It takes 48-72 hours to process rx refill requests Ask patient to call pharmacy to ensure rx is ready before heading there.

## 2021-09-18 ENCOUNTER — Telehealth: Payer: Self-pay | Admitting: Urology

## 2021-09-18 NOTE — Telephone Encounter (Signed)
DOS - 10/16/21  ARTHROPLASTY 2ND RIGHT --- 28122  SPOKE WITH LAUNDRINA D. WITH [employer] AND SHE STATED THAT FOR CPT CODE 98102 NO PRIOR AUTH IS REQUIRED.  REF # Wendy Sanders D. 09/18/21 AT 8:34 AM CT

## 2021-09-28 ENCOUNTER — Telehealth: Payer: Self-pay | Admitting: Podiatry

## 2021-09-28 NOTE — Telephone Encounter (Signed)
How long will pt. Be oow?

## 2021-10-16 ENCOUNTER — Other Ambulatory Visit: Payer: Self-pay | Admitting: Podiatry

## 2021-10-16 ENCOUNTER — Encounter: Payer: Self-pay | Admitting: Podiatry

## 2021-10-16 DIAGNOSIS — M93271 Osteochondritis dissecans, right ankle and joints of right foot: Secondary | ICD-10-CM

## 2021-10-16 HISTORY — PX: FOOT SURGERY: SHX648

## 2021-10-16 MED ORDER — GABAPENTIN 300 MG PO CAPS
300.0000 mg | ORAL_CAPSULE | Freq: Three times a day (TID) | ORAL | 0 refills | Status: DC
Start: 2021-10-16 — End: 2021-10-20

## 2021-10-16 MED ORDER — ACETAMINOPHEN 500 MG PO TABS
1000.0000 mg | ORAL_TABLET | Freq: Four times a day (QID) | ORAL | 0 refills | Status: AC | PRN
Start: 2021-10-16 — End: 2021-10-30

## 2021-10-16 MED ORDER — IBUPROFEN 600 MG PO TABS: 600.0000 mg | ORAL_TABLET | Freq: Four times a day (QID) | ORAL | 0 refills | Status: AC | PRN

## 2021-10-16 MED ORDER — OXYCODONE HCL 5 MG PO TABS: 5.0000 mg | ORAL_TABLET | ORAL | 0 refills | Status: AC | PRN

## 2021-10-16 NOTE — Progress Notes (Signed)
10/16/21  R 2nd implant arthroplasty

## 2021-10-20 ENCOUNTER — Other Ambulatory Visit: Payer: Self-pay | Admitting: Podiatry

## 2021-10-20 ENCOUNTER — Encounter: Payer: No Typology Code available for payment source | Admitting: Family Medicine

## 2021-10-22 ENCOUNTER — Ambulatory Visit (INDEPENDENT_AMBULATORY_CARE_PROVIDER_SITE_OTHER): Payer: No Typology Code available for payment source

## 2021-10-22 ENCOUNTER — Ambulatory Visit (INDEPENDENT_AMBULATORY_CARE_PROVIDER_SITE_OTHER): Payer: No Typology Code available for payment source | Admitting: Podiatry

## 2021-10-22 DIAGNOSIS — S90121A Contusion of right lesser toe(s) without damage to nail, initial encounter: Secondary | ICD-10-CM | POA: Diagnosis not present

## 2021-10-22 DIAGNOSIS — M9271 Juvenile osteochondrosis of metatarsus, right foot: Secondary | ICD-10-CM

## 2021-10-22 DIAGNOSIS — S92324G Nondisplaced fracture of second metatarsal bone, right foot, subsequent encounter for fracture with delayed healing: Secondary | ICD-10-CM

## 2021-10-25 NOTE — Progress Notes (Signed)
  Subjective:  Patient ID: Wendy Sanders, female    DOB: March 19, 2000,  MRN: [id number]  Chief Complaint  Patient presents with   Routine Post Op    POV #1 DOS 10/16/2021 JOINT REPLACEMENT OF RIGHT 2ND TOE JOINT     21 y.o. female returns for post-op check.   Review of Systems: Negative except as noted in the HPI. Denies N/V/F/Ch.   Objective:  There were no vitals filed for this visit. There is no height or weight on file to calculate BMI. Constitutional Well developed. Well nourished.  Vascular Foot warm and well perfused. Capillary refill normal to all digits.  Calf is soft and supple, no posterior calf or knee pain, negative Homans' sign  Neurologic Normal speech. Oriented to person, place, and time. Epicritic sensation to light touch grossly present bilaterally.  Dermatologic Skin healing well without signs of infection. Skin edges well coapted without signs of infection.  Orthopedic: Tenderness to palpation noted about the surgical site.   Multiple view plain film radiographs: Implant well-positioned and seated Assessment:   1. Freiberg's disease, right    Plan:  Patient was evaluated and treated and all questions answered.  S/p foot surgery right -Progressing as expected post-operatively. -XR: Noted above no complication noted -WB Status: WBAT in CAM boot -Sutures: Plan to remove in 2 weeks. -Medications: No refills required -Foot redressed.  Return in about 2 weeks (around 11/05/2021) for post op (no x-rays), suture removal.

## 2021-10-29 ENCOUNTER — Other Ambulatory Visit: Payer: Self-pay | Admitting: Podiatry

## 2021-11-05 ENCOUNTER — Ambulatory Visit (INDEPENDENT_AMBULATORY_CARE_PROVIDER_SITE_OTHER): Payer: No Typology Code available for payment source | Admitting: Podiatry

## 2021-11-05 ENCOUNTER — Encounter: Payer: No Typology Code available for payment source | Admitting: Family Medicine

## 2021-11-05 DIAGNOSIS — M9271 Juvenile osteochondrosis of metatarsus, right foot: Secondary | ICD-10-CM

## 2021-11-05 NOTE — Progress Notes (Signed)
  Subjective:  Patient ID: Wendy Sanders, female    DOB: 10-26-2000,  MRN: [id number]  Chief Complaint  Patient presents with   Routine Post Op    POV #2 DOS 10/16/2021 JOINT REPLACEMENT OF RIGHT 2ND TOE JOINT     21 y.o. female returns for post-op check.  Having some tenderness under the foot  Review of Systems: Negative except as noted in the HPI. Denies N/V/F/Ch.   Objective:  There were no vitals filed for this visit. There is no height or weight on file to calculate BMI. Constitutional Well developed. Well nourished.  Vascular Foot warm and well perfused. Capillary refill normal to all digits.  Calf is soft and supple, no posterior calf or knee pain, negative Homans' sign  Neurologic Normal speech. Oriented to person, place, and time. Epicritic sensation to light touch grossly present bilaterally.  Dermatologic Skin healing well without signs of infection. Skin edges well coapted without signs of infection.  Orthopedic: She does have some limited range of motion and tenderness in the plantar MTPJ   Multiple view plain film radiographs: Implant well-positioned and seated Assessment:   1. Freiberg's disease, right    Plan:  Patient was evaluated and treated and all questions answered.  S/p foot surgery right -Overall doing well.  Sutures removed uneventfully.  She may transition gradually from the cam boot to regular shoe gear as tolerated.  Continue range of motion exercises.  I will see her back in 3 weeks for new x-rays  Return in about 3 weeks (around 11/26/2021) for post op (new x-rays).

## 2021-11-12 ENCOUNTER — Other Ambulatory Visit: Payer: Self-pay | Admitting: Podiatry

## 2021-11-19 ENCOUNTER — Other Ambulatory Visit: Payer: Self-pay | Admitting: Podiatry

## 2021-11-26 ENCOUNTER — Ambulatory Visit (INDEPENDENT_AMBULATORY_CARE_PROVIDER_SITE_OTHER): Payer: No Typology Code available for payment source

## 2021-11-26 ENCOUNTER — Ambulatory Visit (INDEPENDENT_AMBULATORY_CARE_PROVIDER_SITE_OTHER): Payer: No Typology Code available for payment source | Admitting: Podiatry

## 2021-11-26 DIAGNOSIS — M86171 Other acute osteomyelitis, right ankle and foot: Secondary | ICD-10-CM

## 2021-11-26 DIAGNOSIS — M9271 Juvenile osteochondrosis of metatarsus, right foot: Secondary | ICD-10-CM

## 2021-11-26 NOTE — Progress Notes (Signed)
  Subjective:  Patient ID: Wendy Sanders, female    DOB: 29-Apr-2000,  MRN: [id number]  Chief Complaint  Patient presents with   Routine Post Op    POV #2 DOS 10/16/2021 JOINT REPLACEMENT OF RIGHT 2ND TOE JOINT     21 y.o. female returns for post-op check.  Tenderness is still present on the bottom side of the joint  Review of Systems: Negative except as noted in the HPI. Denies N/V/F/Ch.   Objective:  There were no vitals filed for this visit. There is no height or weight on file to calculate BMI. Constitutional Well developed. Well nourished.  Vascular Foot warm and well perfused. Capillary refill normal to all digits.  Calf is soft and supple, no posterior calf or knee pain, negative Homans' sign  Neurologic Normal speech. Oriented to person, place, and time. Epicritic sensation to light touch grossly present bilaterally.  Dermatologic Incision is well under nonhypertrophic  Orthopedic: She does have some limited range of motion and tenderness in the plantar MTPJ that is persistent   Multiple view plain film radiographs: Implant well-positioned and seated, no change in position or alignment Assessment:   1. Freiberg's disease, right    Plan:  Patient was evaluated and treated and all questions answered.  S/p foot surgery right -Overall still doing fairly well.  She does still have stiffness and some tenderness around the joint.  There is no signs of infection.  I do think this likely will take time for manipulation of the joint and scar tissue to breakdown.  We discussed certain shoe gear that may be beneficial for this which she has purchased.  We also discussed the option of a custom molded foot orthosis to offload this area and support the arch as well which we may consider if she is not improving.  Expect this will take some time.  Return in about 3 weeks (around 11/26/2021) for post op (new x-rays).

## 2021-12-10 ENCOUNTER — Encounter: Payer: Self-pay | Admitting: Family Medicine

## 2021-12-10 ENCOUNTER — Ambulatory Visit (INDEPENDENT_AMBULATORY_CARE_PROVIDER_SITE_OTHER): Payer: No Typology Code available for payment source | Admitting: Family Medicine

## 2021-12-10 VITALS — BP 118/76 | HR 82 | Temp 99.1°F | Resp 16 | Ht 63.0 in | Wt 156.0 lb

## 2021-12-10 DIAGNOSIS — F418 Other specified anxiety disorders: Secondary | ICD-10-CM

## 2021-12-10 DIAGNOSIS — Z0000 Encounter for general adult medical examination without abnormal findings: Secondary | ICD-10-CM | POA: Diagnosis not present

## 2021-12-10 DIAGNOSIS — R519 Headache, unspecified: Secondary | ICD-10-CM

## 2021-12-10 NOTE — Assessment & Plan Note (Signed)
New.  Pt reports sxs started ~6 weeks ago.  Has hx of menstrual migraines years ago but those have not been an issue recently.  Feels that Has will start in the front and then generalize and it feels like her head is being squeezed.  Some nausea but denies photo or phonophobia.  Doesn't feel that they are tension related.  Seemingly no pattern at this time.  Encouraged her to keep a HA log so we can better determine a trigger or possible pattern.  Recommended Tylenol or Ibuprofen at onset of HA to prevent it from generalizing.  Pt expressed understanding and is in agreement w/ plan.

## 2021-12-10 NOTE — Assessment & Plan Note (Signed)
Pt's PE WNL.  Due to start paps but pt was not prepared for that today.  Will do at upcoming visit.  UTD on Tdap.  No need for labs due to lack of risk factors.  Anticipatory guidance provided.

## 2021-12-10 NOTE — Assessment & Plan Note (Signed)
Deteriorated.  Pt is struggling at school and will fail a class this semester.  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  She feels mood is situational and is not interested in changing meds at this time.  Will follow.

## 2021-12-10 NOTE — Progress Notes (Signed)
   Subjective:    Patient ID: Wendy Sanders, female    DOB: 2000-06-19, 21 y.o.   MRN: [id number]  HPI CPE- UTD on Tdap.  Due to start paps  Patient Care Team    Relationship Specialty Notifications Start End  Midge Minium, MD PCP - General Family Medicine  03/03/18       Review of Systems Patient reports no vision/ hearing changes, adenopathy,fever, weight change,  persistant/recurrent hoarseness , swallowing issues, chest pain, palpitations, edema, persistant/recurrent cough, hemoptysis, dyspnea (rest/exertional/paroxysmal nocturnal), gastrointestinal bleeding (melena, rectal bleeding), abdominal pain, significant heartburn, bowel changes, GU symptoms (dysuria, hematuria, incontinence), Gyn symptoms (abnormal  bleeding, pain),  syncope, focal weakness, memory loss, numbness & tingling, skin/hair/nail changes, abnormal bruising or bleeding.   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  Pt is not interested in adjusting medications.    + Has- occurring randomly.  Started about 6 weeks ago.  Not sure if they are hormone or stress related.  Pt reports Has will start frontal but then spread to the rest of her head and it will feel like her 'whole head is being squeezed' and having some burning.  No sensitivity to light or sound.  No obvious tightness of neck.  Resolve spontaneously.  Tylenol will also help.  Nothing worsens HA.  Pt reports she 'tries' to drink adequate fluids    Objective:   Physical Exam General Appearance:    Alert, cooperative, no distress, appears stated age  Head:    Normocephalic, without obvious abnormality, atraumatic  Eyes:    PERRL, conjunctiva/corneas clear, EOM's intact both eyes  Ears:    Normal TM's and external ear canals, both ears  Nose:   Nares normal, septum midline, mucosa normal, no drainage    or sinus tenderness  Throat:   Lips, mucosa, and tongue normal; teeth and gums normal  Neck:   Supple,  symmetrical, trachea midline, no adenopathy;    Thyroid: no enlargement/tenderness/nodules  Back:     Symmetric, no curvature, ROM normal, no CVA tenderness  Lungs:     Clear to auscultation bilaterally, respirations unlabored  Chest Wall:    No tenderness or deformity   Heart:    Regular rate and rhythm, S1 and S2 normal, no murmur, rub   or gallop  Breast Exam:    Deferred to GYN  Abdomen:     Soft, non-tender, bowel sounds active all four quadrants,    no masses, no organomegaly  Genitalia:    Deferred to GYN  Rectal:    Extremities:   Extremities normal, atraumatic, no cyanosis or edema  Pulses:   2+ and symmetric all extremities  Skin:   Skin color, texture, turgor normal, no rashes or lesions  Lymph nodes:   Cervical, supraclavicular, and axillary nodes normal  Neurologic:   CNII-XII intact, normal strength, sensation and reflexes    throughout          Assessment & Plan:

## 2021-12-10 NOTE — Patient Instructions (Addendum)
Follow up in 2-3 months to recheck headaches but sooner if needed No need for labs today based on lack of risk factors Try and monitor your headaches and keep a log of anything that might show [place] a pattern- time, what you are doing, eating or not, stressed or not, etc Make sure you have tylenol or ibuprofen to take once the headache starts to avoid becoming severe Drink LOTS of fluids Call with any questions or concerns Hang in there! Happy Holidays!!!!

## 2022-01-14 ENCOUNTER — Ambulatory Visit (INDEPENDENT_AMBULATORY_CARE_PROVIDER_SITE_OTHER): Payer: No Typology Code available for payment source | Admitting: Podiatry

## 2022-01-14 ENCOUNTER — Ambulatory Visit (INDEPENDENT_AMBULATORY_CARE_PROVIDER_SITE_OTHER): Payer: No Typology Code available for payment source

## 2022-01-14 DIAGNOSIS — S92324G Nondisplaced fracture of second metatarsal bone, right foot, subsequent encounter for fracture with delayed healing: Secondary | ICD-10-CM | POA: Diagnosis not present

## 2022-01-14 DIAGNOSIS — M9271 Juvenile osteochondrosis of metatarsus, right foot: Secondary | ICD-10-CM | POA: Diagnosis not present

## 2022-01-15 ENCOUNTER — Telehealth: Payer: Self-pay | Admitting: Podiatry

## 2022-01-15 MED ORDER — METHYLPREDNISOLONE 4 MG PO TBPK: ORAL_TABLET | ORAL | 0 refills | Status: DC

## 2022-01-15 MED ORDER — DICLOFENAC SODIUM 75 MG PO TBEC: 75.0000 mg | DELAYED_RELEASE_TABLET | Freq: Two times a day (BID) | ORAL | 1 refills | Status: DC

## 2022-01-15 NOTE — Telephone Encounter (Signed)
Rx sent 

## 2022-01-15 NOTE — Telephone Encounter (Signed)
Pt was checking on her two Rx that was going to be called in.   Please advise

## 2022-01-18 ENCOUNTER — Other Ambulatory Visit: Payer: Self-pay | Admitting: Family Medicine

## 2022-01-18 NOTE — Progress Notes (Signed)
  Subjective:  Patient ID: Wendy Sanders, female    DOB: 2000-04-22,  MRN: [id number]  Chief Complaint  Patient presents with   Routine Post Op    DOS 10/16/2021 JOINT REPLACEMENT OF RIGHT 2ND TOE JOINT        22 y.o. female returns for post-op check.  Tenderness is still present on the bottom side of the joint, she says it is quite painful by the end of the day  Review of Systems: Negative except as noted in the HPI. Denies N/V/F/Ch.   Objective:  There were no vitals filed for this visit. There is no height or weight on file to calculate BMI. Constitutional Well developed. Well nourished.  Vascular Foot warm and well perfused. Capillary refill normal to all digits.  Calf is soft and supple, no posterior calf or knee pain, negative Homans' sign  Neurologic Normal speech. Oriented to person, place, and time. Epicritic sensation to light touch grossly present bilaterally.  Dermatologic Incision is well under nonhypertrophic  Orthopedic: She does have some limited range of motion and tenderness in the plantar MTPJ that is persistent, relatively unchanged since last visit   Multiple view plain film radiographs: Implant well-positioned and seated, no change in position or alignment since last radiographs taken Assessment:   1. Freiberg's disease, right   2. Closed nondisplaced fracture of second metatarsal bone of right foot with delayed healing, subsequent encounter    Plan:  Patient was evaluated and treated and all questions answered.  S/p foot surgery right -Still having quite a bit of pain here.  I recommend we rest and return to the boot for long periods of time on the foot such as work.  I also recommend a prednisone taper and diclofenac p.o.  Then she can return to shoes.  If not improving or worsens again at that point then I would recommend a CT scan to evaluate there may be some hypertrophic bone underneath the joint.  Custom molded orthoses may be beneficial for her  long-term as well  Return in about 8 weeks (around 03/11/2022) for follow up from surgery, new right foot xrays (left foot as well if painful).

## 2022-02-03 ENCOUNTER — Encounter: Payer: Self-pay | Admitting: Family Medicine

## 2022-02-04 MED ORDER — NORETHINDRONE ACET-ETHINYL EST 1-20 MG-MCG PO TABS: 1.0000 | ORAL_TABLET | Freq: Every day | ORAL | 3 refills | Status: DC

## 2022-02-22 ENCOUNTER — Other Ambulatory Visit: Payer: Self-pay

## 2022-02-22 MED ORDER — NORETHINDRONE ACET-ETHINYL EST 1-20 MG-MCG PO TABS: 1.0000 | ORAL_TABLET | Freq: Every day | ORAL | 3 refills | Status: DC

## 2022-03-10 ENCOUNTER — Ambulatory Visit (INDEPENDENT_AMBULATORY_CARE_PROVIDER_SITE_OTHER): Payer: No Typology Code available for payment source

## 2022-03-10 ENCOUNTER — Ambulatory Visit (INDEPENDENT_AMBULATORY_CARE_PROVIDER_SITE_OTHER): Payer: No Typology Code available for payment source | Admitting: Podiatry

## 2022-03-10 DIAGNOSIS — M778 Other enthesopathies, not elsewhere classified: Secondary | ICD-10-CM | POA: Diagnosis not present

## 2022-03-10 DIAGNOSIS — M7741 Metatarsalgia, right foot: Secondary | ICD-10-CM | POA: Diagnosis not present

## 2022-03-10 DIAGNOSIS — M7742 Metatarsalgia, left foot: Secondary | ICD-10-CM

## 2022-03-10 DIAGNOSIS — M9271 Juvenile osteochondrosis of metatarsus, right foot: Secondary | ICD-10-CM

## 2022-03-10 DIAGNOSIS — M21961 Unspecified acquired deformity of right lower leg: Secondary | ICD-10-CM

## 2022-03-11 ENCOUNTER — Ambulatory Visit: Payer: No Typology Code available for payment source | Admitting: Podiatry

## 2022-03-11 NOTE — Progress Notes (Signed)
  Subjective:  Patient ID: Wendy Sanders, female    DOB: 09-11-2000,  MRN: [id number]  Chief Complaint  Patient presents with   [facility] DISEASE    8 weeks for follow up from surgery, new right foot xrays (left foot as well if painful)     22 y.o. female returns for post-op check.  She returns for follow-up.  Still quite painful.  Left foot not as bad.  Prednisone and diclofenac was not helpful  Review of Systems: Negative except as noted in the HPI. Denies N/V/F/Ch.   Objective:  There were no vitals filed for this visit. There is no height or weight on file to calculate BMI. Constitutional Well developed. Well nourished.  Vascular Foot warm and well perfused. Capillary refill normal to all digits.  Calf is soft and supple, no posterior calf or knee pain, negative Homans' sign  Neurologic Normal speech. Oriented to person, place, and time. Epicritic sensation to light touch grossly present bilaterally.  Dermatologic Incision is well, skin scar itself is not hypertrophic but there is significant scar tissue surrounding the metatarsal phalangeal joint  Orthopedic: She does have some limited range of motion and tenderness in the plantar second MTPJ that is persistent, relatively unchanged since last visit.  Pain and sensitivity dorsal portion of the joint as well now overlying the scar and base of the second toe   Multiple view plain film radiographs: New radiographs taken today are unchanged alignment show well-positioned and well-seated implant Assessment:   1. 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 Metatarsalgia of both feet   4. Capsulitis of foot, right    Plan:  Patient was evaluated and treated and all questions answered.  S/p foot surgery right -Continues to have quite a bit of pain.  We discussed further diagnostic imaging including a CT scan to evaluate the bone structure around the implant which on plain film x-ray does still  appear to be stable, on ultrasound likely would be necessary as well to evaluate the soft tissue structures and any additional pathology surrounding the joint.  Prior to this has a final therapeutic option we discussed corticosteroid injection into the scar tissue along the nerves and into the joint.  Following a field block with lidocaine and Marcaine and consent, the scar tissue on the dorsal MTPJ and intra-articular into the joint, 20 mg of Kenalog and 4 mg dexamethasone was injected in multiple areas.  She tolerated this well.  We also discussed offloading with a custom molded orthosis.  I do believe these are medically necessary due to her severe disease and congenital deformity.  She was casted for these today with metatarsal pads and unloading of the second MTPJ.  She will let me know how she is doing in 2 to 4 weeks and if not improving by that point would recommend we proceed with ultrasound and CT scan  No follow-ups on file.

## 2022-03-19 ENCOUNTER — Encounter: Payer: Self-pay | Admitting: Podiatry

## 2022-03-19 DIAGNOSIS — M9271 Juvenile osteochondrosis of metatarsus, right foot: Secondary | ICD-10-CM

## 2022-03-19 DIAGNOSIS — M778 Other enthesopathies, not elsewhere classified: Secondary | ICD-10-CM

## 2022-03-19 DIAGNOSIS — M21961 Unspecified acquired deformity of right lower leg: Secondary | ICD-10-CM

## 2022-04-16 ENCOUNTER — Telehealth: Payer: Self-pay | Admitting: Podiatry

## 2022-04-16 NOTE — Telephone Encounter (Signed)
Pt was calling checking to see if her Orthotics are ready for pick up.

## 2022-04-19 ENCOUNTER — Ambulatory Visit
Admission: RE | Admit: 2022-04-19 | Discharge: 2022-04-19 | Disposition: A | Discharge status: Home / Self Care | Payer: No Typology Code available for payment source | Source: Ambulatory Visit | Attending: Podiatry | Admitting: Podiatry

## 2022-04-19 ENCOUNTER — Ambulatory Visit
Admission: RE | Admit: 2022-04-19 | Discharge: 2022-04-19 | Disposition: A | Discharge status: Home / Self Care | Payer: No Typology Code available for payment source | Source: Ambulatory Visit | Attending: Podiatry

## 2022-04-19 DIAGNOSIS — M778 Other enthesopathies, not elsewhere classified: Secondary | ICD-10-CM

## 2022-04-19 DIAGNOSIS — M21961 Unspecified acquired deformity of right lower leg: Secondary | ICD-10-CM

## 2022-04-19 DIAGNOSIS — M9271 Juvenile osteochondrosis of metatarsus, right foot: Secondary | ICD-10-CM

## 2022-04-26 ENCOUNTER — Other Ambulatory Visit: Payer: No Typology Code available for payment source

## 2022-04-28 ENCOUNTER — Other Ambulatory Visit: Payer: No Typology Code available for payment source

## 2022-05-03 ENCOUNTER — Ambulatory Visit: Payer: No Typology Code available for payment source | Admitting: Family Medicine

## 2022-05-17 ENCOUNTER — Ambulatory Visit (INDEPENDENT_AMBULATORY_CARE_PROVIDER_SITE_OTHER): Payer: No Typology Code available for payment source | Admitting: Family Medicine

## 2022-05-17 ENCOUNTER — Encounter: Payer: Self-pay | Admitting: Family Medicine

## 2022-05-17 VITALS — BP 124/68 | HR 89 | Temp 98.1°F | Resp 18 | Ht 63.0 in | Wt 169.1 lb

## 2022-05-17 DIAGNOSIS — T50905A Adverse effect of unspecified drugs, medicaments and biological substances, initial encounter: Secondary | ICD-10-CM

## 2022-05-17 DIAGNOSIS — F3281 Premenstrual dysphoric disorder: Secondary | ICD-10-CM | POA: Diagnosis not present

## 2022-05-17 DIAGNOSIS — R635 Abnormal weight gain: Secondary | ICD-10-CM

## 2022-05-17 DIAGNOSIS — N926 Irregular menstruation, unspecified: Secondary | ICD-10-CM | POA: Diagnosis not present

## 2022-05-17 MED ORDER — DROSPIREN-ETH ESTRAD-LEVOMEFOL 3-0.02-0.451 MG PO TABS: 1.0000 | ORAL_TABLET | Freq: Every day | ORAL | 3 refills | Status: DC

## 2022-05-17 NOTE — Patient Instructions (Addendum)
Follow up in 6-8 weeks to recheck mood When you finish your current pill pack, start the new one Continue to monitor your symptoms- the data is so helpful!! Try and limit your salt intake- this will help your bloating Drink LOTS of water Call with any questions or concerns Hang in there!!!

## 2022-05-17 NOTE — Progress Notes (Signed)
   Subjective:    Patient ID: Wendy Sanders, female    DOB: [birth year], 21 y.o.   MRN: [id number]  HPI Weight gain- pt has gained 13 lbs since November.  Pt reports bloating to the point 'i look like I'm pregnant'.  Pt denies changes in diet, feels like she's exercising more.  Mood- pt notes that the week prior to menses will develop 'a big slump'.  Doesn't want to get out of bed, is very sad.  Will have nausea, headaches.  Menstrual irregularity- pt reports periods will come the same week every month but she will have irregular spotting, cycles are now variable in length (ranging 5-10 days)  Is currently on Norethindrone- ethinyl estradiol.   Review of Systems For ROS see HPI     Objective:   Physical Exam Vitals reviewed.  Constitutional:      General: She is not in acute distress.    Appearance: She is well-developed. She is not ill-appearing.  HENT:     Head: Normocephalic and atraumatic.  Abdominal:     General: Bowel sounds are normal. There is no distension.     Tenderness: There is no abdominal tenderness. There is no guarding or rebound.  Skin:    General: Skin is warm and dry.  Neurological:     General: No focal deficit present.     Mental Status: She is alert and oriented to person, place, and time.  Psychiatric:        Mood and Affect: Mood normal.        Behavior: Behavior normal.           Assessment & Plan:   PMDD/Weight gain due to medication/Menstrual irregularity- new.  Suspect this is related to her current OCP (Norethindrone- ethinyl estradiol).  Will switch to [facility] (after confirming no personal or family hx of blood clots) and monitor for symptom improvement.  Pt will monitor mood, weight, bloating, and cycle.  Will follow.

## 2022-05-22 ENCOUNTER — Other Ambulatory Visit: Payer: Self-pay | Admitting: Family Medicine

## 2022-05-24 ENCOUNTER — Ambulatory Visit: Payer: No Typology Code available for payment source | Admitting: Family Medicine

## 2022-06-07 ENCOUNTER — Telehealth: Payer: Self-pay

## 2022-06-07 ENCOUNTER — Other Ambulatory Visit (HOSPITAL_COMMUNITY): Payer: Self-pay

## 2022-06-07 NOTE — Telephone Encounter (Signed)
Pharmacy Patient Advocate Encounter   Received notification from [facility] that prior authorization for Drospiren-Eth Estrad-Levomefol 3-0.02-0.451MG  tablets is required/requested.   PA submitted on 06/07/22 to (ins) [employer] via Cover[employer]Meds Key or (Medicaid) confirmation # [id number] Status is pending

## 2022-06-08 ENCOUNTER — Other Ambulatory Visit (HOSPITAL_COMMUNITY): Payer: Self-pay

## 2022-06-08 NOTE — Telephone Encounter (Signed)
Pt aware of Rx approval

## 2022-06-08 NOTE — Telephone Encounter (Signed)
Encourage patient to contact the pharmacy for refills or they can request refills through [facility]  WHAT PHARMACY WOULD THEY LIKE THIS SENT TO:  [id number] - [place], [place] - [address] [place] [address] [place] [address]   Pt called stating she needs PA for Drospirenone-Ethinyl Estradiol-Levomefol (BEYAZ) 3-0.02-0.451 MG tablet  NOTES/COMMENTS FROM PATIENT:      Front office please notify patient: It takes 48-72 hours to process rx refill requests Ask patient to call pharmacy to ensure rx is ready before heading there.

## 2022-06-08 NOTE — Telephone Encounter (Signed)
Patient Advocate Encounter  Prior Authorization for Drospiren-Eth Estrad-Levomefol 3-0.02-0.451MG  tablets has been approved with [employer].    PA# [id number] Effective dates: 06/08/22 through 06/07/23  Per [employer] test claim, copay for 84 days supply is $0

## 2022-06-08 NOTE — Telephone Encounter (Signed)
I have informed pt that the PA was submitted yesterday and that insurance has 24 hours to 14 days to respond

## 2022-07-19 ENCOUNTER — Ambulatory Visit: Payer: No Typology Code available for payment source | Admitting: Family Medicine

## 2022-07-27 NOTE — Progress Notes (Signed)
DOS 10/16/2021  Joint replacement of right second toe joint

## 2022-08-09 ENCOUNTER — Ambulatory Visit: Payer: No Typology Code available for payment source | Admitting: Family Medicine

## 2022-08-16 ENCOUNTER — Ambulatory Visit: Payer: No Typology Code available for payment source | Admitting: Family Medicine

## 2022-08-16 ENCOUNTER — Encounter: Payer: Self-pay | Admitting: Family Medicine

## 2022-08-16 VITALS — BP 118/80 | HR 82 | Temp 98.7°F | Resp 17 | Ht 63.0 in | Wt 171.1 lb

## 2022-08-16 DIAGNOSIS — F418 Other specified anxiety disorders: Secondary | ICD-10-CM

## 2022-08-16 DIAGNOSIS — N946 Dysmenorrhea, unspecified: Secondary | ICD-10-CM

## 2022-08-16 MED ORDER — FLUOXETINE HCL 20 MG PO CAPS: 20.0000 mg | ORAL_CAPSULE | Freq: Every day | ORAL | 3 refills | Status: DC

## 2022-08-16 NOTE — Patient Instructions (Signed)
Follow up in 6 weeks to recheck mood- sooner if needed Continue the same birth control pills at this time STOP the Lexapro and START the Fluoxetine once daily Call with any questions or concerns Stay Safe!  Stay Healthy! Enjoy the rest of your summer!!!

## 2022-08-16 NOTE — Assessment & Plan Note (Signed)
Deteriorated.  Pt reports depression is stable but anxiety is much worse.  Having a hard time w/ sleep, racing thoughts, unable to sit still.  Discussed switching Lexapro to something else vs adding a medication.  At this time, will switch Lexapro to Fluoxetine 20mg  daily and monitor closely for improvement.  Pt to f/u in 6 weeks but she knows she can reach out sooner if she has any issues.

## 2022-08-16 NOTE — Assessment & Plan Note (Signed)
Pt reports overall, sxs are improving since switching to [facility].  Spotting is less, no longer gaining weight, cramping is less.  She would like to keep things as they are for now.  Will follow.

## 2022-08-16 NOTE — Progress Notes (Signed)
   Subjective:    Patient ID: Wendy Sanders, female    DOB: [birth year], 22 y.o.   MRN: [id number]  HPI Birth control- pt was switched to [facility] in May.  Pt hasn't noticed much of a difference since switching medication.  Continues to spot- 'it's different every month'.  Often occurs 1-2 weeks before her period and will last 2-4 days but typically not consecutive days.  'not even enough to fill a panty liner'.  Continues to have 'random' cramping pain.  No longer gaining weight which makes her happy.  Depression w/ anxiety- pt reports depression is ok but is struggling w/ anxiety.  Mind is racing, difficulty w/ sleep, always 'fidgeting'.  Thought Lexapro worked better when she first started it, but now doesn't see much improvement.   Review of Systems For ROS see HPI     Objective:   Physical Exam Vitals reviewed.  Constitutional:      General: She is not in acute distress.    Appearance: Normal appearance. She is not ill-appearing.  HENT:     Head: Normocephalic and atraumatic.  Skin:    General: Skin is warm and dry.  Neurological:     General: No focal deficit present.     Mental Status: She is alert and oriented to person, place, and time.  Psychiatric:        Mood and Affect: Mood normal.        Behavior: Behavior normal.        Thought Content: Thought content normal.           Assessment & Plan:

## 2022-08-26 ENCOUNTER — Encounter (INDEPENDENT_AMBULATORY_CARE_PROVIDER_SITE_OTHER): Payer: Self-pay

## 2022-09-27 ENCOUNTER — Ambulatory Visit: Payer: No Typology Code available for payment source | Admitting: Family Medicine

## 2022-10-11 ENCOUNTER — Encounter: Payer: Self-pay | Admitting: Family Medicine

## 2022-10-11 ENCOUNTER — Ambulatory Visit (INDEPENDENT_AMBULATORY_CARE_PROVIDER_SITE_OTHER): Payer: No Typology Code available for payment source | Admitting: Family Medicine

## 2022-10-11 VITALS — BP 122/80 | HR 90 | Temp 98.2°F | Wt 171.6 lb

## 2022-10-11 DIAGNOSIS — F418 Other specified anxiety disorders: Secondary | ICD-10-CM | POA: Diagnosis not present

## 2022-10-11 MED ORDER — BUPROPION HCL 75 MG PO TABS: 75.0000 mg | ORAL_TABLET | Freq: Two times a day (BID) | ORAL | 3 refills | Status: DC

## 2022-10-11 MED ORDER — FLUOXETINE HCL 10 MG PO CAPS: 10.0000 mg | ORAL_CAPSULE | Freq: Every day | ORAL | 0 refills | Status: DC

## 2022-10-11 NOTE — Progress Notes (Signed)
   Subjective:    Patient ID: Wendy Sanders, female    DOB: 25-Jul-2000, 22 y.o.   MRN: [id number]  HPI Depression w/ anxiety- at last visit pt was switched to Prozac from Lexapro.  Weight has been stable since May.  Pt doesn't like the Fluoxetine.  Feels that it has made anxiety worse.  When anxious will feel physically sick.  Has never been on Wellbutrin.   Review of Systems For ROS see HPI     Objective:   Physical Exam Vitals reviewed.  Constitutional:      General: She is not in acute distress.    Appearance: Normal appearance. She is not ill-appearing.  HENT:     Head: Normocephalic and atraumatic.  Skin:    General: Skin is warm and dry.  Neurological:     General: No focal deficit present.     Mental Status: She is alert and oriented to person, place, and time.  Psychiatric:        Mood and Affect: Mood normal.        Behavior: Behavior normal.        Thought Content: Thought content normal.           Assessment & Plan:

## 2022-10-11 NOTE — Assessment & Plan Note (Signed)
Ongoing issue for pt.  At last visit we switched her Lexapro to Fluoxetine but she doesn't like the medication change.  She finds herself more anxious and even feels like she will be physically sick.  And she's very frustrated by her inability to lose weight.  Will wean off Fluoxetine and start Wellbutrin.  Will continue to follow closely.  Pt expressed understanding and is in agreement w/ plan.

## 2022-10-11 NOTE — Patient Instructions (Signed)
Follow up in 4-6 weeks to recheck mood DECREASE the Fluoxetine to 10mg  daily for at least 2 weeks and then stop START the Bupropion (Wellbutrin) twice daily Use Vitamin E cream for any stretch marks Call with any questions or concerns Hang in there!!!  You've got this!!!

## 2022-11-08 ENCOUNTER — Other Ambulatory Visit: Payer: Self-pay | Admitting: Family Medicine

## 2022-11-15 ENCOUNTER — Ambulatory Visit (INDEPENDENT_AMBULATORY_CARE_PROVIDER_SITE_OTHER): Payer: No Typology Code available for payment source | Admitting: Family Medicine

## 2022-11-15 ENCOUNTER — Telehealth: Payer: Self-pay

## 2022-11-15 ENCOUNTER — Encounter: Payer: Self-pay | Admitting: Family Medicine

## 2022-11-15 VITALS — BP 122/70 | HR 89 | Temp 98.3°F | Ht 63.0 in | Wt 174.2 lb

## 2022-11-15 DIAGNOSIS — F418 Other specified anxiety disorders: Secondary | ICD-10-CM

## 2022-11-15 DIAGNOSIS — Z23 Encounter for immunization: Secondary | ICD-10-CM | POA: Diagnosis not present

## 2022-11-15 DIAGNOSIS — R82998 Other abnormal findings in urine: Secondary | ICD-10-CM | POA: Diagnosis not present

## 2022-11-15 DIAGNOSIS — R635 Abnormal weight gain: Secondary | ICD-10-CM | POA: Diagnosis not present

## 2022-11-15 DIAGNOSIS — L906 Striae atrophicae: Secondary | ICD-10-CM

## 2022-11-15 LAB — CBC WITH DIFFERENTIAL/PLATELET
Basophils Absolute: 0 10*3/uL (ref 0.0–0.1)
Basophils Relative: 0.5 % (ref 0.0–3.0)
Eosinophils Absolute: 0.1 10*3/uL (ref 0.0–0.7)
Eosinophils Relative: 1.9 % (ref 0.0–5.0)
HCT: 42.8 % (ref 36.0–46.0)
Hemoglobin: 14.1 g/dL (ref 12.0–15.0)
Lymphocytes Relative: 42.5 % (ref 12.0–46.0)
Lymphs Abs: 3.2 10*3/uL (ref 0.7–4.0)
MCHC: 32.9 g/dL (ref 30.0–36.0)
MCV: 86.5 fL (ref 78.0–100.0)
Monocytes Absolute: 0.6 10*3/uL (ref 0.1–1.0)
Monocytes Relative: 8.4 % (ref 3.0–12.0)
Neutro Abs: 3.5 10*3/uL (ref 1.4–7.7)
Neutrophils Relative %: 46.7 % (ref 43.0–77.0)
Platelets: 313 10*3/uL (ref 150.0–400.0)
RBC: 4.95 Mil/uL (ref 3.87–5.11)
RDW: 12 % (ref 11.5–15.5)
WBC: 7.4 10*3/uL (ref 4.0–10.5)

## 2022-11-15 LAB — BASIC METABOLIC PANEL
BUN: 11 mg/dL (ref 6–23)
CO2: 25 meq/L (ref 19–32)
Calcium: 9.2 mg/dL (ref 8.4–10.5)
Chloride: 103 meq/L (ref 96–112)
Creatinine, Ser: 0.75 mg/dL (ref 0.40–1.20)
GFR: 113.38 mL/min (ref >60.00)
Glucose, Bld: 81 mg/dL (ref 70–99)
Potassium: 4.7 meq/L (ref 3.5–5.1)
Sodium: 138 meq/L (ref 135–145)

## 2022-11-15 LAB — TSH: TSH: 2.1 u[IU]/mL (ref 0.35–5.50)

## 2022-11-15 LAB — CORTISOL: Cortisol, Plasma: 11.6 ug/dL

## 2022-11-15 NOTE — Assessment & Plan Note (Signed)
Ongoing issue.  Pt has only been taking the Wellbutrin x2 weeks and only once daily.  Was not aware that this was to be twice daily.  Rather than increasing dose, will have pt take as directed for another 2-3 weeks and then let me know how she is feeling.  Pt expressed understanding and is in agreement w/ plan.

## 2022-11-15 NOTE — Telephone Encounter (Signed)
I am not familiar with 24 hour urines and was not aware that was the order associated with 24 hour urines I can print out directions for the patient and prepare a container for her to take with her

## 2022-11-15 NOTE — Telephone Encounter (Signed)
I dont see any urine ordered for the patient? Please advise

## 2022-11-15 NOTE — Patient Instructions (Addendum)
Follow up by MyChart message in 2-3 weeks We'll notify you of your lab results and make any changes if needed START the Wellbutrin TWICE daily Call with any questions or concerns Hang in there!!!

## 2022-11-15 NOTE — Progress Notes (Signed)
   Subjective:    Patient ID: Milayna Sanders, female    DOB: [birth year], 22 y.o.   MRN: [id number]  HPI Depression- ongoing issue.  At last visit we started weaning fluoxetine and started Wellbutrin 75mg  BID.  Is currently off Fluoxetine and not experiencing withdrawal sxs.  Pt has only been on Wellbutrin x2 weeks.  Has not noticed any change.  Is only taking med once daily.  No side effects from starting medication.    Weight gain w/ stretch marks- 'really thick wide purple stretch marks'.  Pt is concerned about Cushing's.  'i just know my body doesn't feel right'   Review of Systems For ROS see HPI     Objective:   Physical Exam Vitals reviewed.  Constitutional:      General: She is not in acute distress.    Appearance: Normal appearance. She is obese. She is not ill-appearing.  HENT:     Head: Normocephalic and atraumatic.  Cardiovascular:     Rate and Rhythm: Normal rate and regular rhythm.  Pulmonary:     Effort: Pulmonary effort is normal. No respiratory distress.  Skin:    General: Skin is warm and dry.     Comments: Wide purple stretch marks across abdomen  Neurological:     General: No focal deficit present.     Mental Status: She is alert and oriented to person, place, and time.  Psychiatric:        Mood and Affect: Mood normal.        Behavior: Behavior normal.        Thought Content: Thought content normal.           Assessment & Plan:   Weight gain/stretch marks- new.  Pt reports she is again gaining weight and is very frustrated.  She knows that recent labs have been normal but she feels that something 'just isn't right'.  Now having wide, purple stretch marks on abdomen, breasts, thighs.  Her weight gain has not been rapid and these marks seems out of proportion to usual findings.  Given this, will check Cortisol level to assess for possible Cushing's.  Pt expressed understanding and is in agreement w/ plan.

## 2022-11-15 NOTE — Telephone Encounter (Signed)
-----   Message from Neena Rhymes sent at 11/15/2022 12:57 PM EST ----- Labs look good!  I'll be interested to see what the 24 hr urine shows [place]

## 2022-11-18 ENCOUNTER — Other Ambulatory Visit: Payer: Self-pay

## 2022-11-18 DIAGNOSIS — L906 Striae atrophicae: Secondary | ICD-10-CM

## 2022-11-18 LAB — CORTISOL, URINE, FREE: Cortisol,F,ug/L,U: 19 ug/L

## 2022-11-22 ENCOUNTER — Encounter: Payer: Self-pay | Admitting: Family Medicine

## 2022-11-22 LAB — CORTISOL, URINE, FREE
Cortisol (Ur), Free: 44 ug/(24.h) — ABNORMAL HIGH (ref 6–42)
Cortisol,F,ug/L,U: 29 ug/L

## 2022-11-22 NOTE — Addendum Note (Signed)
Addended by: Sheliah Hatch on: 11/22/2022 03:38 PM   Modules accepted: Orders

## 2022-11-24 LAB — HM PAP SMEAR

## 2022-12-24 ENCOUNTER — Telehealth: Payer: Self-pay

## 2022-12-24 NOTE — Telephone Encounter (Signed)
Patient called and left a message - she has to have an MRI for a different issue. She is concerned about her implant - Is it ok to have the MRI? Please advise - thanks

## 2022-12-27 ENCOUNTER — Other Ambulatory Visit: Payer: Self-pay | Admitting: Endocrinology

## 2022-12-27 DIAGNOSIS — R7989 Other specified abnormal findings of blood chemistry: Secondary | ICD-10-CM

## 2023-01-17 ENCOUNTER — Other Ambulatory Visit: Payer: No Typology Code available for payment source

## 2023-02-08 ENCOUNTER — Encounter: Payer: Self-pay | Admitting: Endocrinology

## 2023-02-13 ENCOUNTER — Ambulatory Visit
Admission: RE | Admit: 2023-02-13 | Discharge: 2023-02-13 | Disposition: A | Discharge status: Home / Self Care | Payer: No Typology Code available for payment source | Source: Ambulatory Visit | Attending: Endocrinology | Admitting: Endocrinology

## 2023-02-13 DIAGNOSIS — R7989 Other specified abnormal findings of blood chemistry: Secondary | ICD-10-CM

## 2023-02-13 MED ORDER — GADOPICLENOL 0.5 MMOL/ML IV SOLN
7.5000 mL | Freq: Once | INTRAVENOUS | Status: AC | PRN
  Administered 2023-02-13: 7.5 mL via INTRAVENOUS

## 2023-04-21 ENCOUNTER — Ambulatory Visit (INDEPENDENT_AMBULATORY_CARE_PROVIDER_SITE_OTHER)

## 2023-04-21 ENCOUNTER — Encounter: Payer: Self-pay | Admitting: Podiatry

## 2023-04-21 ENCOUNTER — Ambulatory Visit (INDEPENDENT_AMBULATORY_CARE_PROVIDER_SITE_OTHER): Admitting: Podiatry

## 2023-04-21 DIAGNOSIS — S90121A Contusion of right lesser toe(s) without damage to nail, initial encounter: Secondary | ICD-10-CM | POA: Diagnosis not present

## 2023-04-21 DIAGNOSIS — M778 Other enthesopathies, not elsewhere classified: Secondary | ICD-10-CM

## 2023-04-21 MED ORDER — DICLOFENAC SODIUM 75 MG PO TBEC: 75.0000 mg | DELAYED_RELEASE_TABLET | Freq: Two times a day (BID) | ORAL | 2 refills | Status: DC

## 2023-04-21 MED ORDER — PREDNISONE 10 MG PO TABS
ORAL_TABLET | ORAL | 0 refills | Status: AC
Start: 2023-04-21 — End: 2023-04-28

## 2023-04-21 NOTE — Progress Notes (Signed)
  Subjective:  Patient ID: Wendy Sanders, female    DOB: 2000-04-15,  MRN: [id number]  Chief Complaint  Patient presents with   Toe Injury    Patient states she has been having pain in her incision for the pass 2 months now, she had surgery 2 years ago on right foot and now she is having throbbing pain in her right foot. Patient is taking ibuprofen  for pain but does not work patient states    23 y.o. female presents with the above complaint. History confirmed with patient.  Was doing quite well until she stubbed the toe on furniture a couple months ago, has taken OTC NSAIDs and Tylenol and has not helped  Objective:  Physical Exam: warm, good capillary refill, no trophic changes or ulcerative lesions, normal DP and PT pulses, normal sensory exam, and mild edema and tenderness around the second MTP joint with range of motion minimal in the 1st or 3rd no bruising ecchymosis or gross deformity.  Range of motion is limited.   Radiographs: Multiple views x-ray of the right foot: Some new osteolysis around the implant without loss of correction or positioning Assessment:   1. Capsulitis of foot, right   2. Contusion of second toe of right foot, initial encounter      Plan:  Patient was evaluated and treated and all questions answered.  I reviewed her x-rays with her and discussed with her she likely has an impaction injury that may be causing some osteolysis around the implant site but has not lost complete correction or loosening or fracture of the implant.  Do not recommend surgical intervention or change at this time.  I recommended rest and immobilization in a surgical shoe and this was dispensed today.  Would like her started prednisone taper and this was sent to her pharmacy followed by diclofenac twice daily.  Return in 1 month to reevaluate.  Return in about 1 month (around 05/21/2023) for follow up on right foot pain .

## 2023-05-20 ENCOUNTER — Telehealth: Payer: Self-pay | Admitting: Family Medicine

## 2023-05-20 MED ORDER — DROSPIREN-ETH ESTRAD-LEVOMEFOL 3-0.02-0.451 MG PO TABS: 1.0000 | ORAL_TABLET | Freq: Every day | ORAL | 3 refills | Status: DC

## 2023-05-20 NOTE — Telephone Encounter (Signed)
 Copied from CRM [id number]. Topic: Clinical - Medication Refill >> May 20, 2023  9:27 AM Jenice Mitts wrote: Medication: Drospirenone-Ethinyl Estradiol -Levomefol (BEYAZ) 3-0.02-0.451 MG tablet  Has the patient contacted their pharmacy? Yes (Agent: If no, request that the patient contact the pharmacy for the refill. If patient does not wish to contact the pharmacy document the reason why and proceed with request.) (Agent: If yes, when and what did the pharmacy advise?)  This is the patient's preferred pharmacy:  [id number] - [place], [place] - [address] [place] [address] Phone: [phone] Fax: [phone]  Is this the correct pharmacy for this prescription? Yes If no, delete pharmacy and type the correct one.   Has the prescription been filled recently? Yes  Is the patient out of the medication? Yes  Has the patient been seen for an appointment in the last year OR does the patient have an upcoming appointment? Yes  Can we respond through MyChart? Yes  Agent: Please be advised that Rx refills may take up to 3 business days. We ask that you follow-up with your pharmacy.

## 2023-05-24 ENCOUNTER — Ambulatory Visit: Admitting: Podiatry

## 2023-06-20 ENCOUNTER — Ambulatory Visit (INDEPENDENT_AMBULATORY_CARE_PROVIDER_SITE_OTHER): Admitting: Podiatry

## 2023-06-20 ENCOUNTER — Encounter: Payer: Self-pay | Admitting: Podiatry

## 2023-06-20 DIAGNOSIS — M778 Other enthesopathies, not elsewhere classified: Secondary | ICD-10-CM | POA: Diagnosis not present

## 2023-06-20 MED ORDER — NAPROXEN 500 MG PO TABS: 500.0000 mg | ORAL_TABLET | Freq: Two times a day (BID) | ORAL | 0 refills | Status: DC | PRN

## 2023-06-21 NOTE — Progress Notes (Signed)
  Subjective:  Patient ID: Wendy Sanders, female    DOB: 2000-10-12,  MRN: [id number]  Chief Complaint  Patient presents with   capsulitis    "It's about the same."    23 y.o. female presents with the above complaint. History confirmed with patient.  Steroid did not help much.  Aleve has helped her more than the medications.  Objective:  Physical Exam: warm, good capillary refill, no trophic changes or ulcerative lesions, normal DP and PT pulses, normal sensory exam, and less edema and tenderness today but still discomforting.  Range of motion is limited.   Radiographs: Multiple views x-ray of the right foot: Some new osteolysis around the implant without loss of correction or positioning Assessment:   1. Capsulitis of foot, right      Plan:  Patient was evaluated and treated and all questions answered.  Has not had much improvement.  We discussed further surgical correction including explant of the implant and interpositional arthroplasty with a dermal allograft.  So far it is fairly manageable with her and pain is intermittent.  Aleve has been more helpful.  Rx for Naprosyn 500 to use twice daily as needed.  She will follow-up with me if it worsens.  Return if symptoms worsen or fail to improve.

## 2023-07-14 DIAGNOSIS — T8484XA Pain due to internal orthopedic prosthetic devices, implants and grafts, initial encounter: Secondary | ICD-10-CM | POA: Insufficient documentation

## 2023-07-20 ENCOUNTER — Other Ambulatory Visit: Payer: Self-pay | Admitting: Podiatry

## 2023-08-03 ENCOUNTER — Encounter: Payer: Self-pay | Admitting: Podiatry

## 2023-08-15 ENCOUNTER — Encounter: Payer: Self-pay | Admitting: Family Medicine

## 2023-08-15 NOTE — Telephone Encounter (Signed)
 Please advise. Patient is requesting a different medication because insurance is not longer going to cover [facility]. Thank you!

## 2023-08-16 ENCOUNTER — Telehealth: Payer: Self-pay

## 2023-08-16 ENCOUNTER — Other Ambulatory Visit (HOSPITAL_COMMUNITY): Payer: Self-pay

## 2023-08-16 NOTE — Telephone Encounter (Signed)
 Patient is looking for a Prior auth for drospirenone-ethinyl estradiol -levomefol (beyaz) 3-0.02-0.451mg  tablet. Is this okay to submit to our Prior Auth team?

## 2023-08-16 NOTE — Telephone Encounter (Signed)
 Sent to PA team.

## 2023-08-16 NOTE — Telephone Encounter (Addendum)
 Pharmacy Patient Advocate Encounter   Received notification from Physician's Office that prior authorization for Drospirenone-Ethinyl Estradiol  3-0.02MG  tablets is required/requested.   Insurance verification completed.   The patient is insured through [facility] .   Per test claim: PA required; PA submitted to above mentioned insurance via CoverMyMeds Key/confirmation #/EOC [id number] Status is pending

## 2023-08-16 NOTE — Telephone Encounter (Signed)
 Copied from CRM [id number]. Topic: Clinical - Medication Prior Auth >> Aug 16, 2023 10:46 AM Burnard DEL wrote: Reason for CRM: Patient would like a PA for Drospirenone-Ethinyl Estradiol -Levomefol (BEYAZ) 3-0.02-0.451 MG tablet. Patient reached out to [employer] and they just told her she needs a PA completed by provider.

## 2023-08-18 ENCOUNTER — Other Ambulatory Visit: Payer: Self-pay | Admitting: Family Medicine

## 2023-08-19 NOTE — Telephone Encounter (Signed)
 Medication approved. I have sent patient a MyChart message letting her know.

## 2023-08-19 NOTE — Telephone Encounter (Signed)
 Pharmacy Patient Advocate Encounter  Received notification from [employer] that Prior Authorization for Drospirenone-Ethinyl Estradiol  3-0.02MG  tablets has been APPROVED from 08/16/23 to 08/15/24   PA #/Case ID/Reference #: [id number]

## 2023-09-23 ENCOUNTER — Telehealth: Payer: Self-pay

## 2023-09-23 NOTE — Telephone Encounter (Signed)
 Copied from CRM #[id number]. Topic: Clinical - Medication Prior Auth >> Sep 23, 2023  3:03 PM Wendy Sanders wrote: Reason for CRM: Artist states that they need an authorization submitted   Drospirenone-Ethinyl Estradiol -Levomefol (BEYAZ) 3-0.02-0.451 MG tablet NCD# [id number]  The one that was submitted was without the Levomefol but needs it in order to be approved please.  Caller is from Member services and did not have the fax number but it is the same one used in the past.  Department phone number-[phone]

## 2023-09-26 ENCOUNTER — Ambulatory Visit (INDEPENDENT_AMBULATORY_CARE_PROVIDER_SITE_OTHER): Admitting: Podiatry

## 2023-09-26 DIAGNOSIS — M9271 Juvenile osteochondrosis of metatarsus, right foot: Secondary | ICD-10-CM | POA: Diagnosis not present

## 2023-09-26 NOTE — Telephone Encounter (Signed)
Pt needs PA

## 2023-09-27 ENCOUNTER — Encounter: Payer: Self-pay | Admitting: Student in an Organized Health Care Education/Training Program

## 2023-09-27 ENCOUNTER — Ambulatory Visit: Admitting: Student in an Organized Health Care Education/Training Program

## 2023-09-27 ENCOUNTER — Other Ambulatory Visit (HOSPITAL_COMMUNITY): Payer: Self-pay

## 2023-09-27 ENCOUNTER — Telehealth: Payer: Self-pay

## 2023-09-27 VITALS — BP 140/89 | HR 85 | Wt 174.8 lb

## 2023-09-27 DIAGNOSIS — F418 Other specified anxiety disorders: Secondary | ICD-10-CM | POA: Diagnosis not present

## 2023-09-27 DIAGNOSIS — W5503XA Scratched by cat, initial encounter: Secondary | ICD-10-CM | POA: Diagnosis not present

## 2023-09-27 MED ORDER — SERTRALINE HCL 50 MG PO TABS: 50.0000 mg | ORAL_TABLET | Freq: Every day | ORAL | 3 refills | Status: DC

## 2023-09-27 NOTE — Progress Notes (Signed)
 Acute Office Visit  Subjective:     Patient ID: Wendy Sanders, female    DOB: 06/30/2000, 23 y.o.   MRN: [id number]  Chief Complaint  Patient presents with   Abrasion    Cat scratch on 9/15 from a stray cat that drew blood when scratched and patient states the cat did likc its paws before scratching her.     HPI  Discussed the use of AI scribe software for clinical note transcription with the patient, who gave verbal consent to proceed.  History of Present Illness Wendy Sanders is a 23 year old female who presents with a cat scratch from a stray cat.  She was scratched on her right hand by a stray cat yesterday. The scratch was deep enough to draw blood. The cat initially appeared hesitant but allowed her to pet it before scratching her. It seemed healthy but was panting heavily and had a droopy eye. She is concerned about the risk of rabies and other infections.  She washed the scratch with soap and water immediately after the incident. She has no other scratches or injuries from the encounter. [place] and has a dog at home.  She has severe anxiety, which may be contributing to her heightened concern about the situation. She is aware that the cat is fed by her neighbors but is not vaccinated or regularly cared for. She is unsure about the cat's vaccination status and does not want to attempt to capture it herself.  No other symptoms reported.      Objective:    BP (!) 140/89   Pulse 85   Wt 174 lb 12.8 oz (79.3 kg)   SpO2 100%   BMI 30.96 kg/m   Physical Exam  Gen: Anxious appearing young woman Ext: On her right arm there are few small scratches, no surrounding erythema, no lymphatic streaking, no lymphadenopathy in the right axilla Neuro: Alert, conversational, full strength in the upper and lower extremities, normal gait and balance Heart: Regular, no murmur Lungs: Unlabored, clear throughout Psych:  Moderately anxious appearing, shaking at times, fidgeting, racing thoughts, organized speech, not pressured, organized thoughts      Assessment & Plan:    Problem List Items Addressed This Visit       Unprioritized   Depression with anxiety   Long history of severe anxiety that has been worsened over the last few months.  Patient reports that she has been spiraling with worry after reading about the possibility of rabies.  In the past she has tried medication treatment with fluoxetine , Lexapro , and bupropion  without much benefit.  We talked about possibilities of using another SSRI or potentially hydroxyzine .  We settled on trying sertraline  50 mg daily.  Close follow-up in 4 weeks for management of anxiety and dose adjustment as needed.      Relevant Medications   sertraline  (ZOLOFT ) 50 MG tablet   Cat scratch - Primary   A scratch from a stray cat shows no signs of infection or lymphadenopathy. Monitor for redness, streaking, or swelling. Return for evaluation if infection signs develop. There is potential rabies exposure due to the stray cat scratch. The cat's stray status and unknown vaccination history increase risk. Although rabies is rare, its severity justifies prophylaxis. Initiate rabies post-exposure prophylaxis with a four-dose vaccine series and rabies immunoglobulin.  I spoke with the emergency department at [facility], they confirmed they do have the vaccine and  the Ig available.  I recommended that the patient contact animal control to capture and observe the cat for rabies signs.  If the cat is healthy after a 10-day observation, can discontinue the vaccination series early.       Meds ordered this encounter  Medications   sertraline  (ZOLOFT ) 50 MG tablet    Sig: Take 1 tablet (50 mg total) by mouth daily.    Dispense:  30 tablet    Refill:  3    Return in about 4 weeks (around 10/25/2023), or if symptoms worsen or fail to improve, for anxiety follow up.  Cleatus Debby Specking, MD

## 2023-09-27 NOTE — Telephone Encounter (Signed)
 PA request has been Submitted. New Encounter has been or will be created for follow up. For additional info see Pharmacy Prior Auth telephone encounter from 09/27/23.

## 2023-09-27 NOTE — Assessment & Plan Note (Signed)
 A scratch from a stray cat shows no signs of infection or lymphadenopathy. Monitor for redness, streaking, or swelling. Return for evaluation if infection signs develop. There is potential rabies exposure due to the stray cat scratch. The cat's stray status and unknown vaccination history increase risk. Although rabies is rare, its severity justifies prophylaxis. Initiate rabies post-exposure prophylaxis with a four-dose vaccine series and rabies immunoglobulin.  I spoke with the emergency department at [facility], they confirmed they do have the vaccine and the Ig available.  I recommended that the patient contact animal control to capture and observe the cat for rabies signs.  If the cat is healthy after a 10-day observation, can discontinue the vaccination series early.

## 2023-09-27 NOTE — Assessment & Plan Note (Signed)
 Long history of severe anxiety that has been worsened over the last few months.  Patient reports that she has been spiraling with worry after reading about the possibility of rabies.  In the past she has tried medication treatment with fluoxetine , Lexapro , and bupropion  without much benefit.  We talked about possibilities of using another SSRI or potentially hydroxyzine .  We settled on trying sertraline  50 mg daily.  Close follow-up in 4 weeks for management of anxiety and dose adjustment as needed.

## 2023-09-27 NOTE — Patient Instructions (Signed)
  VISIT SUMMARY: Today, you were seen for a scratch on your right hand from a stray cat. You were concerned about the risk of rabies and other infections. We discussed the necessary steps to monitor and treat the scratch, as well as the importance of rabies post-exposure prophylaxis. We also addressed your anxiety regarding the situation.  YOUR PLAN: -CAT SCRATCH TO RIGHT HAND: A scratch from a stray cat shows no signs of infection or swollen lymph nodes. You should monitor the scratch for any redness, streaking, or swelling. If you notice any signs of infection, please return for further evaluation.  -RABIES EXPOSURE, POST-EXPOSURE PROPHYLAXIS RECOMMENDED: Due to the scratch from a stray cat with unknown vaccination status, there is a potential risk of rabies. Rabies is a serious but rare disease. We recommend starting a rabies post-exposure prophylaxis, which includes a four-dose vaccine series and rabies immunoglobulin. You should coordinate with local emergency departments for the vaccine administration. Additionally, contact animal control to capture and observe the cat for any signs of rabies. If the cat remains healthy, we may adjust the prophylaxis plan.  -ANXIETY: You are experiencing anxiety due to the potential rabies exposure and the treatment process. We reassured you about the low risk of rabies and the precautionary nature of the treatment. We also encouraged you to seek support from your fianc during this time.  INSTRUCTIONS: Please monitor the scratch on your hand for any signs of infection such as redness, streaking, or swelling. If you notice any of these signs, return for further evaluation. Begin the rabies post-exposure prophylaxis as recommended, coordinating with local emergency departments for the vaccine series and rabies immunoglobulin. Contact animal control to capture and observe the cat. Seek support from your fianc to help manage your anxiety during this process.

## 2023-09-27 NOTE — Telephone Encounter (Signed)
 Pharmacy Patient Advocate Encounter   Received notification from Pt Calls Messages that prior authorization for Drospiren-Eth Estrad-Levomefol 3-0.02-0.451MG  tablets  is required/requested.   Insurance verification completed.   The patient is insured through [facility] .   Per test claim: PA required; PA submitted to above mentioned insurance via Latent Key/confirmation #/EOC [id number] Status is pending

## 2023-09-28 ENCOUNTER — Ambulatory Visit: Admitting: Family Medicine

## 2023-09-29 ENCOUNTER — Encounter: Payer: Self-pay | Admitting: Podiatry

## 2023-09-29 ENCOUNTER — Telehealth: Payer: Self-pay | Admitting: Podiatry

## 2023-09-29 ENCOUNTER — Encounter: Payer: Self-pay | Admitting: Student in an Organized Health Care Education/Training Program

## 2023-09-29 MED ORDER — HYDROXYZINE PAMOATE 25 MG PO CAPS: 25.0000 mg | ORAL_CAPSULE | Freq: Three times a day (TID) | ORAL | 0 refills | Status: AC | PRN

## 2023-09-29 NOTE — Progress Notes (Signed)
  Subjective:  Patient ID: Wendy Sanders, female    DOB: 12/23/2000,  MRN: [id number]  Chief Complaint  Patient presents with   Foot Pain    Pain in implant site. She wants to discuss removal. States its getting worse. Pain scale 3to4/10 daily    23 y.o. female presents with the above complaint. History confirmed with patient.  She returns for follow-up.  She is not doing much better.  Objective:  Physical Exam: warm, good capillary refill, no trophic changes or ulcerative lesions, normal DP and PT pulses, normal sensory exam, and mild edema and tenderness today but still discomforting.  Range of motion is limited.   Radiographs: Multiple views x-ray of the right foot: Some new osteolysis around the implant without loss of correction or positioning Assessment:   1. Freiberg's disease, right      Plan:  Patient was evaluated and treated and all questions answered.  We again reviewed her treatment options.  I do not think that further implant arthroplasty would be recommended at this point.  I did recommend the option of removal of the implant and interpositional arthroplasty with a dermal allograft.  We discussed the risks and benefits of this.  She understands and wishes to proceed.  All questions addressed.  Informed consent signed and reviewed.  Surgery will be scheduled for later this year.   Surgical plan:  Procedure: - Removal of the second MTP implant and interpositional arthroplasty  Location: - [facility]  Anesthesia plan: - Sedation with local  Postoperative pain plan: - Tylenol  1000 mg every 6 hours, ibuprofen  600 mg every 6 hours, gabapentin  300 mg every 8 hours x5 days, oxycodone  5 mg 1-2 tabs every 6 hours only as needed  DVT prophylaxis: - None required  WB Restrictions / DME needs: - Weightbearing as tolerated in CAM boot postop   No follow-ups on file.

## 2023-09-29 NOTE — Telephone Encounter (Signed)
 Patient is requesting a fast acting anxiety medication. Patient was seen on 09/27/23 by Dr. Jerrell for this concern. Please advise

## 2023-09-29 NOTE — Telephone Encounter (Signed)
 Received surgical consent form.  Left message for pt to call to schedule her surgery.

## 2023-09-30 ENCOUNTER — Other Ambulatory Visit (HOSPITAL_COMMUNITY): Payer: Self-pay

## 2023-09-30 NOTE — Telephone Encounter (Signed)
 Pharmacy Patient Advocate Encounter  Received notification from [facility] that Prior Authorization for Drospiren-Eth Estrad-Levomefol 3-0.02-0.451MG  tablets  has been APPROVED from 09/30/23 to 09/26/24. Ran test claim, Copay is $0. This test claim was processed through [facility]- copay amounts may vary at other pharmacies due to pharmacy/plan contracts, or as the patient moves through the different stages of their insurance plan.   PA #/Case ID/Reference #: [id number]

## 2023-09-30 NOTE — Telephone Encounter (Signed)
 Sent mychart message letting patient know about approval.

## 2023-10-20 ENCOUNTER — Telehealth: Payer: Self-pay | Admitting: Podiatry

## 2023-10-20 NOTE — Telephone Encounter (Signed)
 Patient called into office to schedule surgery. Patient is scheduled for 01/13/2024 per her request. Not on any blood thinners or GLP1 medications. Patient has confirmed receipt of surgery bag and aware [facility] will contact 24-48 hours prior with arrival time. [facility] in chart is patients preferred pharmacy.

## 2023-11-02 ENCOUNTER — Telehealth: Payer: Self-pay | Admitting: Podiatry

## 2023-11-02 NOTE — Telephone Encounter (Signed)
 Patient reached out today in regards to her expected downtime after surgery. She is trying to decide if she wants to reschedule her surgery or not and this would help her determine that. Please advise, thanks.

## 2023-12-06 ENCOUNTER — Encounter: Payer: Self-pay | Admitting: Podiatry

## 2023-12-14 ENCOUNTER — Encounter: Payer: Self-pay | Admitting: Family Medicine

## 2023-12-15 ENCOUNTER — Ambulatory Visit: Payer: Self-pay

## 2023-12-15 NOTE — Telephone Encounter (Signed)
 noted

## 2023-12-15 NOTE — Telephone Encounter (Signed)
 Pt states she was told to make an appt for lab requests that she has. Appt scheduled.   Copied from CRM #[id number]. Topic: Clinical - Red Word Triage >> Dec 15, 2023 11:59 AM Alfonso ORN wrote: Red Word that prompted transfer to Nurse Triage: pt called to f/u with pcp on ongoing symptoms anxiety fatigue and hair loss . Pt stated some of these symptoms have gotten worse

## 2023-12-19 ENCOUNTER — Ambulatory Visit: Admitting: Family Medicine

## 2023-12-19 ENCOUNTER — Encounter: Payer: Self-pay | Admitting: Family Medicine

## 2023-12-19 VITALS — BP 110/70 | HR 70 | Temp 97.8°F | Ht 63.0 in | Wt 178.5 lb

## 2023-12-19 DIAGNOSIS — Z23 Encounter for immunization: Secondary | ICD-10-CM

## 2023-12-19 DIAGNOSIS — R82998 Other abnormal findings in urine: Secondary | ICD-10-CM

## 2023-12-19 DIAGNOSIS — R5383 Other fatigue: Secondary | ICD-10-CM

## 2023-12-19 DIAGNOSIS — R635 Abnormal weight gain: Secondary | ICD-10-CM

## 2023-12-19 DIAGNOSIS — E168 Other specified disorders of pancreatic internal secretion: Secondary | ICD-10-CM

## 2023-12-19 DIAGNOSIS — L906 Striae atrophicae: Secondary | ICD-10-CM

## 2023-12-19 LAB — VITAMIN D 25 HYDROXY (VIT D DEFICIENCY, FRACTURES): VITD: 15.86 ng/mL — ABNORMAL LOW (ref 30.00–100.00)

## 2023-12-19 LAB — HEMOGLOBIN A1C: Hgb A1c MFr Bld: 5.1 % (ref 4.6–6.5)

## 2023-12-19 LAB — CBC WITH DIFFERENTIAL/PLATELET
Basophils Absolute: 0 K/uL (ref 0.0–0.1)
Basophils Relative: 0.3 % (ref 0.0–3.0)
Eosinophils Absolute: 0.1 K/uL (ref 0.0–0.7)
Eosinophils Relative: 1.2 % (ref 0.0–5.0)
HCT: 41.5 % (ref 36.0–46.0)
Hemoglobin: 14.2 g/dL (ref 12.0–15.0)
Lymphocytes Relative: 39.3 % (ref 12.0–46.0)
Lymphs Abs: 2.9 K/uL (ref 0.7–4.0)
MCHC: 34.1 g/dL (ref 30.0–36.0)
MCV: 83.8 fl (ref 78.0–100.0)
Monocytes Absolute: 0.5 K/uL (ref 0.1–1.0)
Monocytes Relative: 6.6 % (ref 3.0–12.0)
Neutro Abs: 3.9 K/uL (ref 1.4–7.7)
Neutrophils Relative %: 52.6 % (ref 43.0–77.0)
Platelets: 289 K/uL (ref 150.0–400.0)
RBC: 4.95 Mil/uL (ref 3.87–5.11)
RDW: 12.8 % (ref 11.5–15.5)
WBC: 7.4 K/uL (ref 4.0–10.5)

## 2023-12-19 LAB — LIPID PANEL
Cholesterol: 134 mg/dL (ref 0–200)
HDL: 50 mg/dL (ref >39.00)
LDL Cholesterol: 67 mg/dL (ref 0–99)
NonHDL: 83.66
Total CHOL/HDL Ratio: 3
Triglycerides: 82 mg/dL (ref 0.0–149.0)
VLDL: 16.4 mg/dL (ref 0.0–40.0)

## 2023-12-19 LAB — TSH: TSH: 2.55 u[IU]/mL (ref 0.35–5.50)

## 2023-12-19 LAB — BASIC METABOLIC PANEL WITH GFR
BUN: 13 mg/dL (ref 6–23)
CO2: 25 meq/L (ref 19–32)
Calcium: 9.4 mg/dL (ref 8.4–10.5)
Chloride: 104 meq/L (ref 96–112)
Creatinine, Ser: 0.66 mg/dL (ref 0.40–1.20)
GFR: 123.97 mL/min (ref >60.00)
Glucose, Bld: 86 mg/dL (ref 70–99)
Potassium: 4.1 meq/L (ref 3.5–5.1)
Sodium: 139 meq/L (ref 135–145)

## 2023-12-19 NOTE — Progress Notes (Unsigned)
   Subjective:    Patient ID: Wendy Sanders, female    DOB: 2000/02/20, 23 y.o.   MRN: [id number]  HPI Pt's concerns today- stretch marks (abd, breasts, arms) thick and dark purple.  Hair loss, HA's, episodic LAD, light headedness, fatigue, weight gain, menorrhagia, body aches, bloating, easy bruising.  Has seen Endo x2- first test for Cushing's was abnormal but dexamethasone  suppression test was normal.  Insulin was very high at 133 despite normal glucose.  Pt reports she started exercising and making dietary changes over the last month.     Review of Systems For ROS see HPI     Objective:   Physical Exam Vitals reviewed.  Constitutional:      General: She is not in acute distress.    Appearance: Normal appearance. She is not ill-appearing.  HENT:     Head: Normocephalic and atraumatic.  Eyes:     Extraocular Movements: Extraocular movements intact.     Conjunctiva/sclera: Conjunctivae normal.  Cardiovascular:     Rate and Rhythm: Normal rate and regular rhythm.  Pulmonary:     Effort: Pulmonary effort is normal. No respiratory distress.     Breath sounds: No wheezing.  Musculoskeletal:     Cervical back: Neck supple.  Lymphadenopathy:     Cervical: No cervical adenopathy.  Skin:    General: Skin is warm and dry.     Comments: Purple striae on abd and breasts  Neurological:     General: No focal deficit present.     Mental Status: She is alert and oriented to person, place, and time.  Psychiatric:        Mood and Affect: Mood normal.        Behavior: Behavior normal.        Thought Content: Thought content normal.           Assessment & Plan:  Elevated urine cortisol w/ sxs of stretch marks, fatigue, weight gain and also elevated insulin level on labs- has seen Endo x2.  First visit she failed the dexamethasone  suppression test and the plan was to refer for pituitary sampling.  This scared pt and she asked to wait.  Sxs continued so last month she saw Endo again but  this time the Dexamethasone  test was normal despite higher cortisol level and now abnormal insulin levels.  She knows that she can do better on diet and exercise but feels like her sxs were dismissed as this was something she was doing to herself w/ lifestyle choices.  This is causing her to have increased anxiety and become frustrated.  Will check labs today that could contribute to fatigue.  Will also refer for 2nd opinion to [facility] end[facility].  Pt expressed understanding and is in agreement w/ plan.

## 2023-12-19 NOTE — Patient Instructions (Signed)
 Follow up as needed or as scheduled We'll notify you of your lab results and make any changes if needed Keep up the good work on healthy diet and regular exercise- you're doing great! We'll call you to schedule your appt at [facility] with any questions or concerns Stay Safe! Hang in there!!!

## 2023-12-20 ENCOUNTER — Other Ambulatory Visit: Payer: Self-pay

## 2023-12-20 ENCOUNTER — Ambulatory Visit: Payer: Self-pay | Admitting: Family Medicine

## 2023-12-20 LAB — IRON,TIBC AND FERRITIN PANEL
%SAT: 17 % (ref 16–45)
Ferritin: 23 ng/mL (ref 16–154)
Iron: 86 ug/dL (ref 40–190)
TIBC: 496 ug/dL — ABNORMAL HIGH (ref 250–450)

## 2023-12-20 MED ORDER — VITAMIN D (ERGOCALCIFEROL) 1.25 MG (50000 UNIT) PO CAPS: 50000.0000 [IU] | ORAL_CAPSULE | ORAL | 0 refills | Status: AC

## 2023-12-20 NOTE — Progress Notes (Signed)
 Patient has additional questions about her TSH level  Sent in Vitamin D  rx.

## 2023-12-21 ENCOUNTER — Telehealth: Payer: Self-pay | Admitting: Podiatry

## 2023-12-21 ENCOUNTER — Encounter: Payer: Self-pay | Admitting: Podiatry

## 2023-12-21 NOTE — Telephone Encounter (Signed)
 Patient called in regards to surgery and what is being done. She states she only aware of the removal of the implant and has questions in regards to the 28112 code and what it entails. Patient requesting call back to review. Thanks

## 2023-12-21 NOTE — Telephone Encounter (Signed)
 DOS- 12/26/2023  OSTECTOMY COMP METATARSAL HEAD RT- 28112 REMOVAL FIXATION DEEP KWIRE/SCREW RT- 20680  [facility] EFFECTIVE DATE- 01/12/2023  DEDUCTIBLE- N/A OOP- $6600 REMAINING- $5332.54  PER [employer] PORTAL, NO PRIOR AUTHS ARE REQUIRED FOR CPT CODES 71887 AND 20680. DECISION ID# [id number]

## 2023-12-21 NOTE — Progress Notes (Signed)
 Patient verbalized understanding

## 2023-12-26 ENCOUNTER — Other Ambulatory Visit: Payer: Self-pay | Admitting: Podiatry

## 2023-12-26 DIAGNOSIS — T8484XA Pain due to internal orthopedic prosthetic devices, implants and grafts, initial encounter: Secondary | ICD-10-CM

## 2023-12-26 DIAGNOSIS — M9271 Juvenile osteochondrosis of metatarsus, right foot: Secondary | ICD-10-CM

## 2023-12-26 MED ORDER — GABAPENTIN 300 MG PO CAPS: 300.0000 mg | ORAL_CAPSULE | Freq: Three times a day (TID) | ORAL | 0 refills | Status: AC

## 2023-12-26 MED ORDER — OXYCODONE HCL 5 MG PO TABS: 5.0000 mg | ORAL_TABLET | ORAL | 0 refills | Status: AC | PRN

## 2023-12-26 MED ORDER — ACETAMINOPHEN 500 MG PO TABS: 1000.0000 mg | ORAL_TABLET | Freq: Four times a day (QID) | ORAL | 0 refills | Status: AC | PRN

## 2024-01-03 ENCOUNTER — Ambulatory Visit

## 2024-01-03 ENCOUNTER — Ambulatory Visit (INDEPENDENT_AMBULATORY_CARE_PROVIDER_SITE_OTHER)

## 2024-01-03 DIAGNOSIS — S92324G Nondisplaced fracture of second metatarsal bone, right foot, subsequent encounter for fracture with delayed healing: Secondary | ICD-10-CM

## 2024-01-03 DIAGNOSIS — M778 Other enthesopathies, not elsewhere classified: Secondary | ICD-10-CM

## 2024-01-03 DIAGNOSIS — M9271 Juvenile osteochondrosis of metatarsus, right foot: Secondary | ICD-10-CM

## 2024-01-03 NOTE — Progress Notes (Signed)
 "  Subjective:  Patient ID: Wendy Sanders, female    DOB: 02-06-2000,  MRN: [id number]  No chief complaint on file.   DOS: 12/26/23 with Dr. Silva Procedure: Right foot HWR second MTP implant with interpositional arthroplasty  23 y.o. female returns for post-op check.  She is doing well in her recovery 1 week postoperative.  She states that the pain has been well-controlled.  She denies any complications and has been compliant with restrictions.  Review of Systems: Negative except as noted in the HPI. Denies N/V/F/Ch.  Past Medical History:  Diagnosis Date   Allergy    Current Medications[1]  Tobacco Use History[2]  Allergies[3] Objective:  There were no vitals filed for this visit. There is no height or weight on file to calculate BMI. Constitutional Well developed. Well nourished.  Vascular Foot warm and well perfused. Capillary refill normal to all digits.  Calf supple, nontender, no erythema, negative Homans  Neurologic Normal speech. Oriented to person, place, and time. Epicritic sensation to light touch grossly present bilaterally.  Dermatologic Skin healing well without signs of infection.  Sutures intact.  Skin edges well coapted without signs of infection. Mild edema around surgical site.   Orthopedic: Mild tenderness to palpation noted about the surgical site within normal limits.  Stable pin present exiting the second digit distally.   Radiographs: 3 weightbearing views of the right foot were taken.  These show removal of hardware at second metatarsophalangeal joint.  Space present within the metatarsophalangeal joint consistent with interpositional arthroplasty.  Pin in place across second metatarsophalangeal joint.  No other acute osseous findings are identified.  Assessment:   1. Closed nondisplaced fracture of second metatarsal bone of right foot with delayed healing, subsequent encounter   2. Freiberg's disease, right   3. Capsulitis of foot, right    Plan:   Patient was evaluated and treated and all questions answered. Doing well in post operative recovery.   S/p Right foot surgery  -Progressing as expected post-operatively.  She is doing well in her recovery. Pin is stable and holding 2nd digit in rectus position.  - She is okay to wash the foot but should not shower and get the pin wet at this stage.  She can apply a dry sterile dressing following washing. -XR: As above, expected postoperative changes -WB Status: Weight bearing as tolerated on her heel in surgical shoe. -Sutures: intact, will be removed at next post-op visit. -Medications: None needed at today's visit -Foot redressed. -XR at next visit: not needed  RTC 1-2 weeks for suture removal     [1]  Current Outpatient Medications:    acetaminophen  (TYLENOL ) 500 MG tablet, Take 2 tablets (1,000 mg total) by mouth every 6 (six) hours as needed for up to 14 days (pain)., Disp: 112 tablet, Rfl: 0   Drospirenone-Ethinyl Estradiol -Levomefol (BEYAZ) 3-0.02-0.451 MG tablet, TAKE 1 TABLET BY MOUTH DAILY, Disp: 84 tablet, Rfl: 3   gabapentin  (NEURONTIN ) 300 MG capsule, Take 1 capsule (300 mg total) by mouth 3 (three) times daily., Disp: 21 capsule, Rfl: 0   hydrOXYzine  (VISTARIL ) 25 MG capsule, Take 1 capsule (25 mg total) by mouth every 8 (eight) hours as needed., Disp: 30 capsule, Rfl: 0   sertraline  (ZOLOFT ) 50 MG tablet, Take 1 tablet (50 mg total) by mouth daily., Disp: 30 tablet, Rfl: 3   Vitamin D , Ergocalciferol , (DRISDOL ) 1.25 MG (50000 UNIT) CAPS capsule, Take 1 capsule (50,000 Units total) by mouth every 7 (seven) days., Disp: 12 capsule, Rfl: 0 [  2]  Social History Tobacco Use  Smoking Status Never  Smokeless Tobacco Never  [3]  Allergies Allergen Reactions   Azithromycin Hives and Rash   "

## 2024-01-05 ENCOUNTER — Encounter: Payer: Self-pay | Admitting: Podiatry

## 2024-01-09 ENCOUNTER — Ambulatory Visit: Admitting: Podiatry

## 2024-01-09 ENCOUNTER — Ambulatory Visit

## 2024-01-09 VITALS — Ht 63.0 in | Wt 178.0 lb

## 2024-01-09 DIAGNOSIS — S92324G Nondisplaced fracture of second metatarsal bone, right foot, subsequent encounter for fracture with delayed healing: Secondary | ICD-10-CM

## 2024-01-09 DIAGNOSIS — M9271 Juvenile osteochondrosis of metatarsus, right foot: Secondary | ICD-10-CM

## 2024-01-11 NOTE — Progress Notes (Signed)
"   °  Subjective:  Patient ID: Wendy Sanders, female    DOB: [birth year],  MRN: [id number]  Chief Complaint  Patient presents with   Foot Pain    RM 21 Patient is here for post-op f/u. Pt states some sharp pain in right foot after dog stepped on foot.    DOS: 12/26/2023 Procedure: Explant of Hemi implant right second metatarsal and interpositional arthroplasty  23 y.o. female returns for post-op check.  The wire rotated but did not translate, pain is better now  Review of Systems: Negative except as noted in the HPI. Denies N/V/F/Ch.   Objective:  There were no vitals filed for this visit. Body mass index is 31.53 kg/m. Constitutional Well developed. Well nourished.  Vascular Foot warm and well perfused. Capillary refill normal to all digits.  Calf is soft and supple, no posterior calf or knee pain, negative Homans' sign  Neurologic Normal speech. Oriented to person, place, and time. Epicritic sensation to light touch grossly present bilaterally.  Dermatologic Skin healing well without signs of infection. Skin edges well coapted without signs of infection.  Orthopedic: Tenderness to palpation noted about the surgical site.   Multiple view plain film radiographs: There is rotation of the Kirschner wire but there is no distal translation or loss of purchase or complication Assessment:   1. Freiberg's disease, right    Plan:  Patient was evaluated and treated and all questions answered.  S/p foot surgery right -Progressing as expected post-operatively.  Wire was rotated back into position we discussed the rotation is okay should not have any distal or proximal translation to notify me if this develops, we reviewed her x-rays and discussed her intraoperative findings.  I discussed with her we should build to pull the pin around 4 to 5 weeks she will follow-up me prior to her trip out of town in late January, pain is well-controlled sutures removed May wash foot at this point as well.   Continue surgical shoe only for now before regular shoe gear at next visit  No follow-ups on file.  "

## 2024-01-17 ENCOUNTER — Encounter

## 2024-01-19 ENCOUNTER — Encounter

## 2024-01-22 ENCOUNTER — Other Ambulatory Visit: Payer: Self-pay | Admitting: Student in an Organized Health Care Education/Training Program

## 2024-01-22 DIAGNOSIS — F418 Other specified anxiety disorders: Secondary | ICD-10-CM

## 2024-01-26 ENCOUNTER — Ambulatory Visit: Admitting: Podiatry

## 2024-01-26 ENCOUNTER — Ambulatory Visit

## 2024-01-26 VITALS — Ht 63.0 in | Wt 178.0 lb

## 2024-01-26 DIAGNOSIS — M9271 Juvenile osteochondrosis of metatarsus, right foot: Secondary | ICD-10-CM

## 2024-01-26 NOTE — Progress Notes (Signed)
"   °  Subjective:  Patient ID: Wendy Sanders, female    DOB: 17-Jun-2000,  MRN: [id number]  Chief Complaint  Patient presents with   Routine Post Op    POV #3 DOS 12/26/2023 RT OSTECT COMP HEAD 2,3,4/ RT REMOVAL FIXATION DEEP KWIRE OR SCREW, pt is here to f/u on right foot after surgery, sutures were removed on last visit, x-rays taken on this visit, states no pain, some discomfort, no other complaints at this time.      DOS: 12/26/2023 Procedure: Explant of Hemi implant right second metatarsal and interpositional arthroplasty  24 y.o. female returns for post-op check.  Doing well pain is controlled  Review of Systems: Negative except as noted in the HPI. Denies N/V/F/Ch.   Objective:  There were no vitals filed for this visit. Body mass index is 31.53 kg/m. Constitutional Well developed. Well nourished.  Vascular Foot warm and well perfused. Capillary refill normal to all digits.  Calf is soft and supple, no posterior calf or knee pain, negative Homans' sign  Neurologic Normal speech. Oriented to person, place, and time. Epicritic sensation to light touch grossly present bilaterally.  Dermatologic Incisions well-healed not hypertrophic  Orthopedic: No pain to palpation noted about the surgical site.   Multiple view plain film radiographs: Kirschner wire position maintained good stability noted no complication of hardware Assessment:   1. Freiberg's disease, right    Plan:  Patient was evaluated and treated and all questions answered.  S/p foot surgery right Pressure wire removed uneventfully small bandage applied May gradually transition back to supportive shoe gear.  Return in 1 month for reevaluation  No follow-ups on file.  "

## 2024-02-02 ENCOUNTER — Encounter

## 2024-02-07 ENCOUNTER — Encounter: Admitting: Podiatry

## 2024-02-23 ENCOUNTER — Encounter: Admitting: Podiatry
